# Patient Record
Sex: Female | Born: 1975 | Race: White | Hispanic: No | Marital: Married | Smoking: Former smoker
Health system: Southern US, Community
[De-identification: ages and names within clinical notes are randomized; demographics above are authoritative.]

## PROBLEM LIST (undated history)

## (undated) DIAGNOSIS — W3400XA Accidental discharge from unspecified firearms or gun, initial encounter: Secondary | ICD-10-CM

## (undated) DIAGNOSIS — R011 Cardiac murmur, unspecified: Secondary | ICD-10-CM

## (undated) DIAGNOSIS — F419 Anxiety disorder, unspecified: Secondary | ICD-10-CM

## (undated) DIAGNOSIS — L309 Dermatitis, unspecified: Secondary | ICD-10-CM

## (undated) DIAGNOSIS — R87629 Unspecified abnormal cytological findings in specimens from vagina: Secondary | ICD-10-CM

## (undated) DIAGNOSIS — R569 Unspecified convulsions: Secondary | ICD-10-CM

## (undated) DIAGNOSIS — I639 Cerebral infarction, unspecified: Secondary | ICD-10-CM

## (undated) DIAGNOSIS — IMO0002 Reserved for concepts with insufficient information to code with codable children: Secondary | ICD-10-CM

## (undated) DIAGNOSIS — R519 Headache, unspecified: Secondary | ICD-10-CM

## (undated) DIAGNOSIS — T1490XA Injury, unspecified, initial encounter: Secondary | ICD-10-CM

## (undated) DIAGNOSIS — R87619 Unspecified abnormal cytological findings in specimens from cervix uteri: Secondary | ICD-10-CM

## (undated) HISTORY — PX: TUBAL LIGATION: SHX77

## (undated) HISTORY — PX: KNEE SURGERY: SHX244

## (undated) HISTORY — DX: Unspecified abnormal cytological findings in specimens from vagina: R87.629

## (undated) HISTORY — DX: Injury, unspecified, initial encounter: T14.90XA

## (undated) HISTORY — PX: PILONIDAL CYST EXCISION: SHX744

## (undated) HISTORY — PX: TRACHEOSTOMY: SUR1362

## (undated) HISTORY — DX: Dermatitis, unspecified: L30.9

---

## 1996-02-04 DIAGNOSIS — T1490XA Injury, unspecified, initial encounter: Secondary | ICD-10-CM

## 1996-02-04 HISTORY — DX: Injury, unspecified, initial encounter: T14.90XA

## 1996-02-04 HISTORY — PX: ABDOMINAL SURGERY: SHX537

## 1998-02-22 ENCOUNTER — Ambulatory Visit (HOSPITAL_COMMUNITY): Admission: RE | Admit: 1998-02-22 | Discharge: 1998-02-22 | Payer: Self-pay | Admitting: Orthopedic Surgery

## 1998-02-22 ENCOUNTER — Encounter: Payer: Self-pay | Admitting: Orthopedic Surgery

## 1998-08-29 ENCOUNTER — Inpatient Hospital Stay (HOSPITAL_COMMUNITY): Admission: RE | Admit: 1998-08-29 | Discharge: 1998-09-04 | Payer: Self-pay | Admitting: Orthopedic Surgery

## 2000-09-21 ENCOUNTER — Encounter: Payer: Self-pay | Admitting: Obstetrics and Gynecology

## 2000-09-21 ENCOUNTER — Ambulatory Visit (HOSPITAL_COMMUNITY): Admission: RE | Admit: 2000-09-21 | Discharge: 2000-09-21 | Payer: Self-pay | Admitting: Obstetrics and Gynecology

## 2000-11-16 ENCOUNTER — Other Ambulatory Visit: Admission: RE | Admit: 2000-11-16 | Discharge: 2000-11-16 | Payer: Self-pay | Admitting: Obstetrics and Gynecology

## 2001-04-14 ENCOUNTER — Inpatient Hospital Stay (HOSPITAL_COMMUNITY): Admission: AD | Admit: 2001-04-14 | Discharge: 2001-04-16 | Payer: Self-pay | Admitting: Obstetrics and Gynecology

## 2004-05-20 ENCOUNTER — Ambulatory Visit (HOSPITAL_COMMUNITY): Admission: RE | Admit: 2004-05-20 | Discharge: 2004-05-20 | Payer: Self-pay | Admitting: Internal Medicine

## 2004-06-03 ENCOUNTER — Encounter: Admission: RE | Admit: 2004-06-03 | Discharge: 2004-06-03 | Payer: Self-pay | Admitting: Internal Medicine

## 2004-06-12 ENCOUNTER — Observation Stay (HOSPITAL_COMMUNITY): Admission: RE | Admit: 2004-06-12 | Discharge: 2004-06-13 | Payer: Self-pay | Admitting: Internal Medicine

## 2005-08-08 ENCOUNTER — Ambulatory Visit (HOSPITAL_COMMUNITY): Admission: RE | Admit: 2005-08-08 | Discharge: 2005-08-08 | Payer: Self-pay | Admitting: General Surgery

## 2005-08-08 ENCOUNTER — Encounter (INDEPENDENT_AMBULATORY_CARE_PROVIDER_SITE_OTHER): Payer: Self-pay | Admitting: Specialist

## 2006-11-20 ENCOUNTER — Ambulatory Visit (HOSPITAL_COMMUNITY): Admission: RE | Admit: 2006-11-20 | Discharge: 2006-11-20 | Payer: Self-pay | Admitting: General Surgery

## 2007-01-15 ENCOUNTER — Other Ambulatory Visit: Admission: RE | Admit: 2007-01-15 | Discharge: 2007-01-15 | Payer: Self-pay | Admitting: Obstetrics and Gynecology

## 2008-01-17 ENCOUNTER — Other Ambulatory Visit: Admission: RE | Admit: 2008-01-17 | Discharge: 2008-01-17 | Payer: Self-pay | Admitting: Obstetrics and Gynecology

## 2009-05-11 ENCOUNTER — Other Ambulatory Visit: Admission: RE | Admit: 2009-05-11 | Discharge: 2009-05-11 | Payer: Self-pay | Admitting: Obstetrics and Gynecology

## 2010-02-24 ENCOUNTER — Encounter: Payer: Self-pay | Admitting: Internal Medicine

## 2010-06-13 ENCOUNTER — Other Ambulatory Visit (HOSPITAL_COMMUNITY)
Admission: RE | Admit: 2010-06-13 | Discharge: 2010-06-13 | Disposition: A | Discharge status: Home / Self Care | Payer: Medicaid Other | Source: Ambulatory Visit | Attending: Obstetrics and Gynecology | Admitting: Obstetrics and Gynecology

## 2010-06-13 ENCOUNTER — Other Ambulatory Visit: Payer: Self-pay | Admitting: Adult Health

## 2010-06-13 DIAGNOSIS — Z01419 Encounter for gynecological examination (general) (routine) without abnormal findings: Secondary | ICD-10-CM | POA: Insufficient documentation

## 2010-06-18 NOTE — Op Note (Signed)
NAMEFAUSTINA, [name]                ACCOUNT NO.:  [id number]   MEDICAL RECORD NO.:  [id number]          PATIENT TYPE:  AMB   LOCATION:  DAY                           FACILITY:  [facility]   PHYSICIAN:  Dalia Heading, M.D.  DATE OF BIRTH:  [birth year]   DATE OF PROCEDURE:  11/20/2006  DATE OF DISCHARGE:                               OPERATIVE REPORT   PREOPERATIVE DIAGNOSIS:  Subcutaneous neoplasms, scalp and chest wall.   POSTOPERATIVE DIAGNOSIS:  Sebaceous cyst, scalp and chest wall.   PROCEDURE:  Excision of sebaceous cyst, scalp and chest wall.   SURGEON:  Dalia Heading, M.D.   ANESTHESIA:  General.   INDICATIONS:  The patient is a 35 year old white female who presents  with two enlarging subcutaneous masses, one behind the right ear in the  scalp and another one along the left posterolateral chest wall.  The  risks and benefits of both procedures were fully explained to the  patient who gave informed consent.   PROCEDURE NOTE:  The patient was placed in the left lateral decubitus  position after general anesthesia was administered.  An area behind the  right ear was prepped and draped using the usual sterile technique with  Betadine.  Surgical site confirmation was performed.   A 1.5-cm subcutaneous mass was present.  Incision was made, and the  sebaceous cyst was found.  This was excised without difficulty.  Any  bleeding was controlled using Bovie electrocautery.  The incision was  instilled with 0.5% Sensorcaine.  The incision was closed using a 4-0  nylon interrupted suture.  Neosporin ointment was then applied.   Next, the patient was placed in the right lateral decubitus position.  A  2-cm sebaceous cyst was found along the posterolateral aspect of the  lower left chest.  This was excised without difficulty.  Any bleeding  was controlled using Bovie electrocautery.  The subcutaneous layer was  instilled with 0.5% Sensorcaine.  The skin was closed using 4-0 nylon  interrupted sutures.  Neosporin ointment and dry sterile dressing were  applied.   All tape and needle counts were correct at the end of the procedures.  The patient was awakened and transferred to the PACU in stable  condition.   COMPLICATIONS:  None.   SPECIMEN:  Sebaceous cysts, scalp and back.   BLOOD LOSS:  Minimal.      Dalia Heading, M.D.  Electronically Signed     MAJ/MEDQ  D:  11/20/2006  T:  11/22/2006  Job:  [id number]   cc:   Corrie Mckusick, M.D.  Fax: [phone]
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
## 2010-06-21 NOTE — Op Note (Signed)
NAMEJANNATUL, [name]                ACCOUNT NO.:  [id number]   MEDICAL RECORD NO.:  [id number]          PATIENT TYPE:  AMB   LOCATION:  DAY                           FACILITY:  [facility]   PHYSICIAN:  Dalia Heading, M.D.  DATE OF BIRTH:  08/15/1975   DATE OF PROCEDURE:  08/08/2005  DATE OF DISCHARGE:                                 OPERATIVE REPORT   PREOPERATIVE DIAGNOSIS:  Recurrent pilonidal cyst.   POSTOPERATIVE DIAGNOSIS:  Recurrent pilonidal cyst.   PROCEDURE:  Excision of pilonidal cyst.   SURGEON:  Dalia Heading, M.D.   ANESTHESIA:  General.   INDICATIONS:  The patient is a 35 year old white female who presents with a  recurrent pilonidal cyst.  She has had a previous excision of the pilonidal  cyst in the past.  The risks and benefits of the procedure were fully  explained, including bleeding, infection, and recurrence of the cyst, were  fully explained to the patient, who gave informed consent.   PROCEDURE NOTE:  The patient was placed in the left lateral decubitus  position after general anesthesia was administered.  The pilonidal region  was prepped and draped using the usual sterile technique with Betadine.  Surgical site confirmation was performed.   The patient was noted to have an opening in the previous surgical scar  tissue over the distal coccyx.  An elliptical incision was made around this  region and taken down to the coccyx.  The specimen was sent to pathology for  further examination.  The wound was irrigated with normal saline.  The  subcutaneous layer was reapproximated using 3-0 Vicryl interrupted sutures.  The skin was closed using 4-0 nylon interrupted sutures.  Sensorcaine 0.5%  was instilled in the surrounding wound.  Betadine ointment and a dry sterile  dressing were applied.   All tape and needle counts were correct at the end of the procedure.  The  patient was awakened and transferred to PACU in stable condition.   COMPLICATIONS:  None.   SPECIMEN:  Pilonidal cyst.   BLOOD LOSS:  Minimal.      Dalia Heading, M.D.  Electronically Signed     MAJ/MEDQ  D:  08/08/2005  T:  08/08/2005  Job:  [id number]   cc:   Julaine Fusi, M.D.  Fax: [phone]

## 2010-06-21 NOTE — Op Note (Signed)
[name], [name]                ACCOUNT NO.:  [id number]   MEDICAL RECORD NO.:  [id number]          PATIENT TYPE:  AMB   LOCATION:  DAY                           FACILITY:  [facility]   PHYSICIAN:  Bernerd Limbo. Destefano, M.D.DATE OF BIRTH:  02-19-1975   DATE OF PROCEDURE:  06/12/2004  DATE OF DISCHARGE:                                 OPERATIVE REPORT   PREOPERATIVE DIAGNOSIS:  Infected pilonidal cyst.   POSTOPERATIVE DIAGNOSIS:  Infected pilonidal cyst.   OPERATION PERFORMED:  Incision of pilonidal cyst.   ANESTHESIA:  Spinal.   SURGEON:  Dr. Bernerd Limbo. Leona Carry, M.D.   OPERATIVE PROCEDURE:  Under spinal anesthesia, the patient was prepped and  draped in the jack-knife position. An elliptical incision was made to excise  a large pilonidal cyst. Dissection was carried down to the level of the  sacrococcygeal fascia and the cyst removed in toto. Bleeders were  electrofulgurated. The subcutaneous tissues were undermined over the  sacrococcygeal fascia. In some places, it was reapproximated with  interrupted 2-0 Vicryl. A layer of fatty tissue was closed over the top of  the sacrococcygeal fascia including the sacrococcygeal fashion with  continuous 2-0 Vicryl. A second layer over this was opposed with continuous  2-0 Vicryl, and the skin was closed with interrupted 3-0 nylon sutures. At  completion of the procedure, all bleeders were under control. There appeared  to be very little tension at the site of the incision. The patient had been  informed preoperatively that if it was impossible to bring it together we  would do a marsupialization. She tolerated the procedure nicely and left in  good condition.      NMD/MEDQ  D:  06/12/2004  T:  06/12/2004  Job:  [id number]

## 2010-06-21 NOTE — Op Note (Signed)
[facility]  Patient:    [name], [name] Visit Number: [id number] MRN: [id number]          Service Type: OBS Location: 4A A420 01 Attending Physician:  Tilda Burrow Dictated by:   Zerita Boers, CNM Proc. Date: 04/14/01 Admit Date:  04/14/2001   CC:         [facility]   Operative Report  ONSET OF LABOR:  04/14/01.  DATE OF DELIVERY: 04/14/01 at 1818 hours.  LENGTH OF FIRST STAGE OF LABOR:  3 hours and 35 minutes.  LENGTH OF SECOND STAGE OF LABOR:  23 minutes.  LENGTH OF THIRD STAGE OF LABOR:  7 minutes.  DELIVERY NOTE:  Dr. Simone Curia was in attendance for baby.  Audra delivered a viable female infant spontaneously without any difficulty of a 34-week-5-day gestation.  Upon delivery the infant had lusty cry, good tone, good movement.  Cord was clamped and infant passed off to Dr. Gerda Diss to attend.  Upon inspection, a significant perineal laceration was noted.  Local infiltration of 1% lidocaine was utilized as local anesthetic and laceration repaired with three interrupted sutures of 2-0 Vicryl with good hemostasis obtained.  A cord blood culture was obtained due to prolonged rupture of membranes and also a culture of the placenta was obtained.  Placenta was delivered spontaneously via Tomasa Blase mechanism.  Three-vessel cord was noted upon inspection.  Also placenta was sent to pathology for inspection.  Infant and mother were both stabilized.  The mother transferred out to the postpartum unit in stable condition and infant to the newborn nursery due to low birth weight of 4 pounds 1 ounce.  Estimated blood loss was approximately 300 cc. Dictated by:   Zerita Boers, CNM Attending Physician:  Tilda Burrow DD:  04/14/01 TD:  04/15/01 Job: [id number]

## 2010-07-23 ENCOUNTER — Other Ambulatory Visit: Payer: Self-pay | Admitting: Obstetrics and Gynecology

## 2010-08-20 ENCOUNTER — Other Ambulatory Visit (HOSPITAL_COMMUNITY)
Admission: RE | Admit: 2010-08-20 | Discharge: 2010-08-20 | Disposition: A | Discharge status: Home / Self Care | Payer: Medicaid Other | Source: Ambulatory Visit | Attending: Obstetrics and Gynecology | Admitting: Obstetrics and Gynecology

## 2010-08-20 ENCOUNTER — Other Ambulatory Visit: Payer: Self-pay | Admitting: Obstetrics and Gynecology

## 2010-08-20 DIAGNOSIS — R87619 Unspecified abnormal cytological findings in specimens from cervix uteri: Secondary | ICD-10-CM | POA: Insufficient documentation

## 2010-11-13 LAB — BASIC METABOLIC PANEL
CO2: 26
Calcium: 9.4
GFR calc Af Amer: >60
Glucose, Bld: 67 — ABNORMAL LOW
Potassium: 4.3
Sodium: 136

## 2010-11-13 LAB — HCG, QUANTITATIVE, PREGNANCY: hCG, Beta Chain, Quant, S: <2

## 2010-11-13 LAB — CBC
MCHC: 33.5
RDW: 13.4

## 2010-12-03 ENCOUNTER — Other Ambulatory Visit (HOSPITAL_COMMUNITY)
Admission: RE | Admit: 2010-12-03 | Discharge: 2010-12-03 | Disposition: A | Discharge status: Home / Self Care | Payer: Medicaid Other | Source: Ambulatory Visit | Attending: Obstetrics and Gynecology | Admitting: Obstetrics and Gynecology

## 2010-12-03 ENCOUNTER — Other Ambulatory Visit: Payer: Self-pay | Admitting: Obstetrics and Gynecology

## 2010-12-03 DIAGNOSIS — Z01419 Encounter for gynecological examination (general) (routine) without abnormal findings: Secondary | ICD-10-CM | POA: Insufficient documentation

## 2011-02-04 NOTE — L&D Delivery Note (Signed)
Delivery Note  Pt is a 36 y.o. G2P0101 at [redacted]w[redacted]d who presented to MAU in active labor. She progressed normally and at 2:49 AM a viable and healthy female was delivered via Vaginal, Spontaneous Delivery (Presentation: vertex ; Occiput Anterior).  APGAR: 8, 9; weight pending.   Placenta status: Intact, Spontaneous.  Cord: 3 vessels with the following complications: None.   Anesthesia: Epidural  Episiotomy: None Lacerations: 2nd degree Suture Repair: 3.0 vicryl Est. Blood Loss (mL): 300  Mom to postpartum.  Baby to nursery-stable.  Maylon Cos, CNM present for delivery.  [name] 09/13/2011, 3:26 AM

## 2011-02-14 LAB — OB RESULTS CONSOLE HIV ANTIBODY (ROUTINE TESTING): HIV: NONREACTIVE

## 2011-02-14 LAB — OB RESULTS CONSOLE GC/CHLAMYDIA: Gonorrhea: NEGATIVE

## 2011-02-14 LAB — OB RESULTS CONSOLE RPR: RPR: NONREACTIVE

## 2011-08-19 LAB — OB RESULTS CONSOLE GBS: GBS: NEGATIVE

## 2011-09-12 ENCOUNTER — Encounter (HOSPITAL_COMMUNITY): Payer: Self-pay | Admitting: Anesthesiology

## 2011-09-12 ENCOUNTER — Inpatient Hospital Stay (HOSPITAL_COMMUNITY)
Admission: AD | Admit: 2011-09-12 | Discharge: 2011-09-12 | Disposition: A | Discharge status: Home / Self Care | Payer: Medicaid Other | Source: Ambulatory Visit | Attending: Obstetrics and Gynecology | Admitting: Obstetrics and Gynecology

## 2011-09-12 ENCOUNTER — Encounter (HOSPITAL_COMMUNITY): Payer: Self-pay | Admitting: *Deleted

## 2011-09-12 ENCOUNTER — Inpatient Hospital Stay (HOSPITAL_COMMUNITY): Payer: Medicaid Other | Admitting: Anesthesiology

## 2011-09-12 ENCOUNTER — Inpatient Hospital Stay (HOSPITAL_COMMUNITY)
Admission: AD | Admit: 2011-09-12 | Discharge: 2011-09-14 | DRG: 775 | Disposition: A | Discharge status: Home / Self Care | Payer: Medicaid Other | Source: Ambulatory Visit | Attending: Obstetrics and Gynecology | Admitting: Obstetrics and Gynecology

## 2011-09-12 DIAGNOSIS — IMO0001 Reserved for inherently not codable concepts without codable children: Secondary | ICD-10-CM

## 2011-09-12 DIAGNOSIS — O99891 Other specified diseases and conditions complicating pregnancy: Secondary | ICD-10-CM | POA: Insufficient documentation

## 2011-09-12 DIAGNOSIS — N949 Unspecified condition associated with female genital organs and menstrual cycle: Secondary | ICD-10-CM | POA: Insufficient documentation

## 2011-09-12 DIAGNOSIS — Z3490 Encounter for supervision of normal pregnancy, unspecified, unspecified trimester: Secondary | ICD-10-CM

## 2011-09-12 DIAGNOSIS — O09529 Supervision of elderly multigravida, unspecified trimester: Secondary | ICD-10-CM | POA: Diagnosis present

## 2011-09-12 DIAGNOSIS — R109 Unspecified abdominal pain: Secondary | ICD-10-CM | POA: Insufficient documentation

## 2011-09-12 DIAGNOSIS — O99334 Smoking (tobacco) complicating childbirth: Secondary | ICD-10-CM | POA: Diagnosis present

## 2011-09-12 HISTORY — DX: Reserved for concepts with insufficient information to code with codable children: IMO0002

## 2011-09-12 HISTORY — DX: Unspecified convulsions: R56.9

## 2011-09-12 HISTORY — DX: Unspecified abnormal cytological findings in specimens from cervix uteri: R87.619

## 2011-09-12 LAB — CBC
HCT: 36.3 % (ref 36.0–46.0)
MCHC: 32.5 g/dL (ref 30.0–36.0)
MCV: 81.8 fL (ref 78.0–100.0)
RDW: 15.2 % (ref 11.5–15.5)

## 2011-09-12 MED ORDER — IBUPROFEN 600 MG PO TABS
600.0000 mg | ORAL_TABLET | Freq: Four times a day (QID) | ORAL | Status: DC | PRN
  Administered 2011-09-13: 600 mg via ORAL
  Filled 2011-09-12: qty 1

## 2011-09-12 MED ORDER — ACETAMINOPHEN 325 MG PO TABS: 650.0000 mg | ORAL_TABLET | ORAL | Status: DC | PRN

## 2011-09-12 MED ORDER — FENTANYL 2.5 MCG/ML BUPIVACAINE 1/10 % EPIDURAL INFUSION (WH - ANES)
14.0000 mL/h | INTRAMUSCULAR | Status: DC
  Administered 2011-09-12: 14 mL/h via EPIDURAL
  Filled 2011-09-12: qty 60

## 2011-09-12 MED ORDER — FLEET ENEMA 7-19 GM/118ML RE ENEM: 1.0000 | ENEMA | RECTAL | Status: DC | PRN

## 2011-09-12 MED ORDER — OXYTOCIN 40 UNITS IN LACTATED RINGERS INFUSION - SIMPLE MED
62.5000 mL/h | Freq: Once | INTRAVENOUS | Status: AC
  Administered 2011-09-13: 250 mL/h via INTRAVENOUS
  Filled 2011-09-12: qty 1000

## 2011-09-12 MED ORDER — SODIUM CHLORIDE 0.9 % IJ SOLN: 3.0000 mL | INTRAMUSCULAR | Status: DC | PRN

## 2011-09-12 MED ORDER — FENTANYL CITRATE 0.05 MG/ML IJ SOLN: 100.0000 ug | INTRAMUSCULAR | Status: DC | PRN

## 2011-09-12 MED ORDER — LACTATED RINGERS IV SOLN
500.0000 mL | INTRAVENOUS | Status: DC | PRN
  Administered 2011-09-12: 1000 mL via INTRAVENOUS

## 2011-09-12 MED ORDER — PHENYLEPHRINE 40 MCG/ML (10ML) SYRINGE FOR IV PUSH (FOR BLOOD PRESSURE SUPPORT)
80.0000 ug | PREFILLED_SYRINGE | INTRAVENOUS | Status: DC | PRN
  Filled 2011-09-12: qty 5

## 2011-09-12 MED ORDER — EPHEDRINE 5 MG/ML INJ
10.0000 mg | INTRAVENOUS | Status: DC | PRN
  Filled 2011-09-12: qty 4

## 2011-09-12 MED ORDER — DIPHENHYDRAMINE HCL 50 MG/ML IJ SOLN: 12.5000 mg | INTRAMUSCULAR | Status: DC | PRN

## 2011-09-12 MED ORDER — CITRIC ACID-SODIUM CITRATE 334-500 MG/5ML PO SOLN: 30.0000 mL | ORAL | Status: DC | PRN

## 2011-09-12 MED ORDER — LIDOCAINE HCL (PF) 1 % IJ SOLN
30.0000 mL | INTRAMUSCULAR | Status: DC | PRN
  Administered 2011-09-13: 30 mL via SUBCUTANEOUS
  Filled 2011-09-12: qty 30

## 2011-09-12 MED ORDER — OXYTOCIN BOLUS FROM INFUSION
250.0000 mL | Freq: Once | INTRAVENOUS | Status: DC
  Filled 2011-09-12: qty 500

## 2011-09-12 MED ORDER — SODIUM CHLORIDE 0.9 % IJ SOLN: 3.0000 mL | Freq: Two times a day (BID) | INTRAMUSCULAR | Status: DC

## 2011-09-12 MED ORDER — PHENYLEPHRINE 40 MCG/ML (10ML) SYRINGE FOR IV PUSH (FOR BLOOD PRESSURE SUPPORT): 80.0000 ug | PREFILLED_SYRINGE | INTRAVENOUS | Status: DC | PRN

## 2011-09-12 MED ORDER — ONDANSETRON HCL 4 MG/2ML IJ SOLN: 4.0000 mg | Freq: Four times a day (QID) | INTRAMUSCULAR | Status: DC | PRN

## 2011-09-12 MED ORDER — LIDOCAINE HCL (PF) 1 % IJ SOLN
INTRAMUSCULAR | Status: DC | PRN
  Administered 2011-09-12 (×3): 4 mL

## 2011-09-12 MED ORDER — LACTATED RINGERS IV SOLN
500.0000 mL | Freq: Once | INTRAVENOUS | Status: AC
  Administered 2011-09-12: 500 mL via INTRAVENOUS

## 2011-09-12 MED ORDER — EPHEDRINE 5 MG/ML INJ: 10.0000 mg | INTRAVENOUS | Status: DC | PRN

## 2011-09-12 MED ORDER — LACTATED RINGERS IV SOLN
INTRAVENOUS | Status: DC
  Administered 2011-09-12: 23:00:00 via INTRAVENOUS

## 2011-09-12 MED ORDER — SODIUM CHLORIDE 0.9 % IV SOLN: 250.0000 mL | INTRAVENOUS | Status: DC | PRN

## 2011-09-12 NOTE — MAU Provider Note (Signed)
[name] E.  

## 2011-09-12 NOTE — Progress Notes (Signed)
Dr Thad Ranger on unit and will see pt

## 2011-09-12 NOTE — Anesthesia Procedure Notes (Signed)
Epidural Patient location during procedure: OB Start time: 09/12/2011 10:54 PM Reason for block: procedure for pain  Staffing Performed by: anesthesiologist   Preanesthetic Checklist Completed: patient identified, site marked, surgical consent, pre-op evaluation, timeout performed, IV checked, risks and benefits discussed and monitors and equipment checked  Epidural Patient position: sitting Prep: site prepped and draped and DuraPrep Patient monitoring: continuous pulse ox and blood pressure Approach: midline Injection technique: LOR air  Needle:  Needle type: Tuohy  Needle gauge: 17 G Needle length: 9 cm Needle insertion depth: 6 cm Catheter type: closed end flexible Catheter size: 19 Gauge Catheter at skin depth: 11 cm Test dose: negative  Assessment Events: blood not aspirated, injection not painful, no injection resistance, negative IV test and no paresthesia  Additional Notes Discussed risk of headache, infection, bleeding, nerve injury and failed or incomplete block.  Patient voices understanding and wishes to proceed.

## 2011-09-12 NOTE — MAU Note (Signed)
Pains started at 0430. Coming every few minutes now.  Denies problem with pregnancy.  First preg was preterm delivery.  No bleeding, yellow thin/watery d/c.

## 2011-09-12 NOTE — OB Triage Provider Note (Signed)
36 y.o. G2P0101 at [redacted]w[redacted]d presenting for contractions of increasing intensity. Seen in MAU earlier today and discharged home. Cervical change over course of MAU stay 3/80/-2 to 4/90/-2. Admitted to labor and delivery.  See H&P for full note.  Camillia Herter Thad Ranger, MD
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
## 2011-09-12 NOTE — Progress Notes (Signed)
Dr Thad Ranger notified that pt returned from walking early due to being more uncomfortable. Will come see pt and reck.

## 2011-09-12 NOTE — MAU Note (Signed)
EFM off. Pt to walk X 1 hr and will recheck cervix

## 2011-09-12 NOTE — MAU Note (Signed)
Donette Larry, CNM notified and given report on pt VE, Reactive Strip, and Hx.  Telephone orders for pt to ambulate in halls obtained.

## 2011-09-12 NOTE — MAU Note (Signed)
Ctx's now every , but stronger.  No bleeding or leaking.

## 2011-09-12 NOTE — Progress Notes (Signed)
Dr Thad Ranger in to see pt. Discussing admission plans with pt

## 2011-09-12 NOTE — Progress Notes (Signed)
Dr Thad Ranger in to see pt and discuss plan of care

## 2011-09-12 NOTE — Progress Notes (Signed)
Pt up to walk with friend. Will return about 2100. To return sooner for leaking of fld. Or heavy vag bleeding

## 2011-09-12 NOTE — Progress Notes (Signed)
[name] is a 36 y.o. G2P0101 at [redacted]w[redacted]d by ultrasound admitted for active labor.  Subjective: Pt blocked and comfortable. SROM approx 11:25 PM, clear. Feeling pressure.  Objective: BP 125/74  Pulse 96  Temp 98.4 F (36.9 C) (Oral)  Resp 20  Ht 5\' 4"  (1.626 m)  Wt 80.74 kg (178 lb)  BMI 30.55 kg/m2  LMP 01/04/2011     FHT:  FHR: 135 bpm, variability: moderate,  accelerations:  Present,  decelerations:  Absent UC:   regular, every 2-3 minutes SVE:   Dilation: 5.5 Effacement (%): 90 Station: -2 Exam by:: Dr. Thad Ranger  Labs: Lab Results  Component Value Date   WBC 15.1* 09/12/2011   HGB 11.8* 09/12/2011   HCT 36.3 09/12/2011   MCV 81.8 09/12/2011   PLT 297 09/12/2011    Assessment / Plan: Spontaneous labor, progressing normally  Labor: Progressing normally Fetal Wellbeing:  Category I Pain Control:  Epidural I/D:  n/a Anticipated MOD:  NSVD  [name] 09/12/2011, 11:30 PM

## 2011-09-12 NOTE — Progress Notes (Signed)
Arlys John RN called report to [employer] in [employer]

## 2011-09-12 NOTE — MAU Note (Signed)
Pt woke up with U/C's and are now every 3 min and getting more intense.  No vaginal bleeding or ROM.  Good fetal movement.

## 2011-09-12 NOTE — Progress Notes (Signed)
Pt returned from walking. States ctxs stronger and "making me bend over". To BR and then efm reapplied.
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
## 2011-09-13 ENCOUNTER — Encounter (HOSPITAL_COMMUNITY): Payer: Self-pay | Admitting: *Deleted

## 2011-09-13 DIAGNOSIS — O99334 Smoking (tobacco) complicating childbirth: Secondary | ICD-10-CM

## 2011-09-13 DIAGNOSIS — O09529 Supervision of elderly multigravida, unspecified trimester: Secondary | ICD-10-CM

## 2011-09-13 LAB — CBC
HCT: 32.4 % — ABNORMAL LOW (ref 36.0–46.0)
Hemoglobin: 10.5 g/dL — ABNORMAL LOW (ref 12.0–15.0)
MCH: 26.8 pg (ref 26.0–34.0)
MCV: 82.7 fL (ref 78.0–100.0)
RBC: 3.92 MIL/uL (ref 3.87–5.11)

## 2011-09-13 MED ORDER — LANOLIN HYDROUS EX OINT: TOPICAL_OINTMENT | CUTANEOUS | Status: DC | PRN

## 2011-09-13 MED ORDER — TETANUS-DIPHTH-ACELL PERTUSSIS 5-2.5-18.5 LF-MCG/0.5 IM SUSP: 0.5000 mL | Freq: Once | INTRAMUSCULAR | Status: DC

## 2011-09-13 MED ORDER — DIPHENHYDRAMINE HCL 25 MG PO CAPS: 25.0000 mg | ORAL_CAPSULE | Freq: Four times a day (QID) | ORAL | Status: DC | PRN

## 2011-09-13 MED ORDER — IBUPROFEN 600 MG PO TABS
600.0000 mg | ORAL_TABLET | Freq: Four times a day (QID) | ORAL | Status: DC
  Administered 2011-09-13 – 2011-09-14 (×5): 600 mg via ORAL
  Filled 2011-09-13 (×5): qty 1

## 2011-09-13 MED ORDER — ONDANSETRON HCL 4 MG/2ML IJ SOLN: 4.0000 mg | INTRAMUSCULAR | Status: DC | PRN

## 2011-09-13 MED ORDER — SIMETHICONE 80 MG PO CHEW: 80.0000 mg | CHEWABLE_TABLET | ORAL | Status: DC | PRN

## 2011-09-13 MED ORDER — OXYCODONE-ACETAMINOPHEN 5-325 MG PO TABS: 1.0000 | ORAL_TABLET | ORAL | Status: DC | PRN

## 2011-09-13 MED ORDER — DIBUCAINE 1 % RE OINT: 1.0000 "application " | TOPICAL_OINTMENT | RECTAL | Status: DC | PRN

## 2011-09-13 MED ORDER — ONDANSETRON HCL 4 MG PO TABS: 4.0000 mg | ORAL_TABLET | ORAL | Status: DC | PRN

## 2011-09-13 MED ORDER — SENNOSIDES-DOCUSATE SODIUM 8.6-50 MG PO TABS
2.0000 | ORAL_TABLET | Freq: Every day | ORAL | Status: DC
  Administered 2011-09-13: 2 via ORAL

## 2011-09-13 MED ORDER — ZOLPIDEM TARTRATE 5 MG PO TABS: 5.0000 mg | ORAL_TABLET | Freq: Every evening | ORAL | Status: DC | PRN

## 2011-09-13 MED ORDER — WITCH HAZEL-GLYCERIN EX PADS: 1.0000 "application " | MEDICATED_PAD | CUTANEOUS | Status: DC | PRN

## 2011-09-13 MED ORDER — BENZOCAINE-MENTHOL 20-0.5 % EX AERO
1.0000 "application " | INHALATION_SPRAY | CUTANEOUS | Status: DC | PRN
  Administered 2011-09-13: 1 via TOPICAL
  Filled 2011-09-13: qty 56

## 2011-09-13 NOTE — Anesthesia Postprocedure Evaluation (Signed)
  Anesthesia Post-op Note  Patient: [name]  Procedure(s) Performed: * No procedures listed *  Patient Location: Mother/Baby  Anesthesia Type: Epidural  Level of Consciousness: awake  Airway and Oxygen Therapy: Patient Spontanous Breathing  Post-op Pain: mild  Post-op Assessment: Patient's Cardiovascular Status Stable and Respiratory Function Stable  Post-op Vital Signs: stable  Complications: No apparent anesthesia complications

## 2011-09-13 NOTE — Progress Notes (Signed)
[name] is a 36 y.o. G2P0101 at [redacted]w[redacted]d by ultrasound admitted for active labor  Subjective: Pt comfortable. Feeling pressure like she needs to have bowel movement.  Objective: BP 118/68  Pulse 81  Temp 98.4 F (36.9 C) (Oral)  Resp 18  Ht 5\' 4"  (1.626 m)  Wt 80.74 kg (178 lb)  BMI 30.55 kg/m2  LMP 01/04/2011     FHT:  FHR: 135 bpm, variability: minimal ,  accelerations:  Present,  decelerations:  Absent 10 minute strip with minimal variability and no accels. Checked pt and turned, strip now more reactive. UC:   regular, every 2 minutes SVE:   Dilation: 6 Effacement (%): 90 Station: -2 Exam by:: Dr. Thad Ranger  Labs: Lab Results  Component Value Date   WBC 15.1* 09/12/2011   HGB 11.8* 09/12/2011   HCT 36.3 09/12/2011   MCV 81.8 09/12/2011   PLT 297 09/12/2011    Assessment / Plan: Spontaneous labor, progressing normally  Labor: Progressing normally Fetal Wellbeing:  Category II Pain Control:  Epidural I/D:  n/a Anticipated MOD:  NSVD  [name] 09/13/2011, 12:00 AM

## 2011-09-14 MED ORDER — DOCUSATE SODIUM 100 MG PO CAPS: 100.0000 mg | ORAL_CAPSULE | Freq: Two times a day (BID) | ORAL | Status: AC

## 2011-09-14 MED ORDER — IBUPROFEN 600 MG PO TABS: 600.0000 mg | ORAL_TABLET | Freq: Four times a day (QID) | ORAL | Status: AC

## 2011-09-14 MED ORDER — IBUPROFEN 200 MG PO TABS: 600.0000 mg | ORAL_TABLET | Freq: Four times a day (QID) | ORAL | Status: AC | PRN

## 2011-09-14 MED ORDER — HYDROCODONE-ACETAMINOPHEN 5-500 MG PO TABS: 1.0000 | ORAL_TABLET | Freq: Four times a day (QID) | ORAL | Status: AC | PRN

## 2011-09-14 NOTE — Discharge Summary (Signed)
Obstetric Discharge Summary Reason for Admission: onset of labor Prenatal Procedures: ultrasound Intrapartum Procedures: spontaneous vaginal delivery Postpartum Procedures: none Complications-Operative and Postpartum: none   Delivery Note At 2:49 AM a viable female was delivered via Vaginal, Spontaneous Delivery (Presentation: ; Occiput Anterior).  APGAR: 8, 9; weight 6 lb 8.1 oz (2951 g).   Placenta status: Intact, Spontaneous.  Cord: 3 vessels with the following complications: None.   Anesthesia: Epidural  Episiotomy: None Lacerations: 2nd degree Suture Repair: none Est. Blood Loss (mL): 300  Mom to postpartum.  Baby to nursery-stable.  Nola Botkins H 09/14/2011, 8:30 AM     H/H: Lab Results  Component Value Date/Time   HGB 10.5* 09/13/2011  7:06 AM   HCT 32.4* 09/13/2011  7:06 AM      Discharge Diagnoses: Term Pregnancy-delivered  Discharge Information: Date: 08/15/2010 Activity: unrestricted Diet: routine Medications: Ibuprofen and Vicodin Breast feeding:  Yes Condition: stable Instructions: refer to practice specific booklet Discharge to: home   Harry Bark H 09/14/2011,8:30 AM

## 2011-09-14 NOTE — Progress Notes (Signed)
Post Partum Day 1, SVD  Subjective: up ad lib, voiding, tolerating PO, + flatus and pain controlled.  Objective: Blood pressure 125/74, pulse 59, temperature 97.8 F (36.6 C), temperature source Oral, resp. rate 18, height 5\' 4"  (1.626 m), weight 80.74 kg (178 lb), last menstrual period 01/04/2011, SpO2 99.00%, unknown if currently breastfeeding.  Physical Exam:  General: alert, cooperative and no distress Lochia: appropriate Uterine Fundus: firm Incision: N/A DVT Evaluation: No evidence of DVT seen on physical exam.   Basename 09/13/11 0706 09/12/11 2155  HGB 10.5* 11.8*  HCT 32.4* 36.3    Assessment/Plan: Discharge home today if tubal ligation to be scheduled as outpatient. Bottle feeding. Plans tubal ligation. Follow up at [facility] in 6 weeks.   LOS: 2 days   [name] 09/14/2011, 7:25 AM

## 2011-09-15 NOTE — Progress Notes (Signed)
Post discharge chart review completed.  

## 2011-12-18 ENCOUNTER — Encounter (HOSPITAL_COMMUNITY): Payer: Self-pay

## 2011-12-23 NOTE — Patient Instructions (Addendum)
   Your procedure is scheduled on: 12/30/2011  Report to [facility] at  8:30   AM.  Call this number if you have problems the morning of surgery: [phone]   Remember:   Do not drink or eat food:After Midnight.  :  Take these medicines the morning of surgery with A SIP OF WATER: none   Do not wear jewelry, make-up or nail polish.  Do not wear lotions, powders, or perfumes. You may wear deodorant.  Do not shave 48 hours prior to surgery. Men may shave face and neck.  Do not bring valuables to the hospital.  Contacts, dentures or bridgework may not be worn into surgery.  Leave suitcase in the car. After surgery it may be brought to your room.  For patients admitted to the hospital, checkout time is 11:00 AM the day of discharge.   Patients discharged the day of surgery will not be allowed to drive home.    Special Instructions: Shower using CHG 2 nights before surgery and the night before surgery.  If you shower the day of surgery use CHG.  Use special wash - you have one bottle of CHG for all showers.  You should use approximately 1/3 of the bottle for each shower.   Please read over the following fact sheets that you were given: Pain Booklet, MRSA Information, Surgical Site Infection Prevention and Care and Recovery After Surgery

## 2011-12-23 NOTE — Patient Instructions (Addendum)
Your procedure is scheduled on: 12/30/2011  Report to [facility] at  8:30   AM.  Call this number if you have problems the morning of surgery: [phone]   Remember:   Do not drink or eat food:After Midnight.  :   Take these medicines the morning of surgery with A SIP OF WATER: none   Do not wear jewelry, make-up or nail polish.  Do not wear lotions, powders, or perfumes. You may wear deodorant.  Do not shave 48 hours prior to surgery. Men may shave face and neck.  Do not bring valuables to the hospital.  Contacts, dentures or bridgework may not be worn into surgery.  Leave suitcase in the car. After surgery it may be brought to your room.  For patients admitted to the hospital, checkout time is 11:00 AM the day of discharge.   Patients discharged the day of surgery will not be allowed to drive home.    Special Instructions: Shower using CHG 2 nights before surgery and the night before surgery.  If you shower the day of surgery use CHG.  Use special wash - you have one bottle of CHG for all showers.  You should use approximately 1/3 of the bottle for each shower.   Please read over the following fact sheets that you were given: Pain Booklet, MRSA Information, Surgical Site Infection Prevention and Care and Recovery After Surgery   Tubal Ligation Care After These instructions give you information on caring for yourself after your procedure. Your doctor may also give you more specific instructions. Call your doctor if you have any problems or questions after your procedure. HOME CARE   Do not drink alcohol for 24 hours.   Do not drive or use public transportation for 24 hours.   Do not sign important papers for 24 hours.   Have someone with you for 24 hours.   Keep eating and doing activities as normal.   Change any bandages (dressings) as told by your doctor.   Do not douche or have sex (intercourse) until your doctor says it is okay.  GET HELP RIGHT AWAY IF:   You have a  rash.   You have trouble breathing.   You have a reaction or have side effects from your medicines.   You have redness, puffiness (swelling), or more pain in the wound area.   You have yellowish-white fluid (pus) coming from the wound or bandage.   You have a fever.   You have a bad smell coming from your wound or bandage.   Your wound opens up after your stiches (sutures) or staples are removed.   You have more belly (abdominal) pain.   You have a lot of bleeding from the vagina.   You have pain in your shoulders and chest.   You feel dizzy or pass out (faint).   You keep feeling sick to your stomach (nauseous) or throw up (vomit).  MAKE SURE YOU:   Understand these instructions.   Will watch this condition.   Will get help right away if you are not doing well or get worse.  Document Released: 10/30/2007 Document Revised: 01/09/2011 Document Reviewed: 10/30/2007 [facility] Patient Information 2012 [place], [place]. Endometrial Ablation Endometrial ablation removes the lining of the uterus (endometrium). It is usually a same day, outpatient treatment. Ablation helps avoid major surgery (such as a hysterectomy). A hysterectomy is removal of the cervix and uterus. Endometrial ablation has less risk and complications, has a shorter recovery period  and is less expensive. After endometrial ablation, most women will have little or no menstrual bleeding. You may not keep your fertility. Pregnancy is no longer likely after this procedure but if you are pre-menopausal, you still need to use a reliable method of birth control following the procedure because pregnancy can occur. REASONS TO HAVE THE PROCEDURE MAY INCLUDE:  Heavy periods.  Bleeding that is causing anemia.  Anovulatory bleeding, very irregular, bleeding.  Bleeding submucous fibroids (on the lining inside the uterus) if they are smaller than 3 centimeters. REASONS NOT TO HAVE THE PROCEDURE MAY INCLUDE:  You wish to have  more children.  You have a pre-cancerous or cancerous problem. The cause of any abnormal bleeding must be diagnosed before having the procedure.  You have pain coming from the uterus.  You have a submucus fibroid larger than 3 centimeters.  You recently had a baby.  You recently had an infection in the uterus.  You have a severe retro-flexed, tipped uterus and cannot insert the instrument to do the ablation.  You had a Cesarean section or deep major surgery on the uterus.  The inner cavity of the uterus is too large for the endometrial ablation instrument. RISKS AND COMPLICATIONS   Perforation of the uterus.  Bleeding.  Infection of the uterus, bladder or vagina.  Injury to surrounding organs.  Cutting the cervix.  An air bubble to the lung (air embolus).  Pregnancy following the procedure.  Failure of the procedure to help the problem requiring hysterectomy.  Decreased ability to diagnose cancer in the lining of the uterus. BEFORE THE PROCEDURE  The lining of the uterus must be tested to make sure there is no pre-cancerous or cancer cells present.  Medications may be given to make the lining of the uterus thinner.  Ultrasound may be used to evaluate the size and look for abnormalities of the uterus.  Future pregnancy is not desired. PROCEDURE  There are different ways to destroy the lining of the uterus.   Resectoscope - radio frequency-alternating electric current is the most common one used.  Cryotherapy - freezing the lining of the uterus.  Heated Free Liquid - heated salt (saline) solution inserted into the uterus.  Microwave - uses high energy microwaves in the uterus.  Thermal Balloon - a catheter with a balloon tip is inserted into the uterus and filled with heated fluid. Your caregiver will talk with you about the method used in this clinic. They will also instruct you on the pros and cons of the procedure. Endometrial ablation is performed along with  a procedure called operative hysteroscopy. A narrow viewing tube is inserted through the birth canal (vagina) and through the cervix into the uterus. A tiny camera attached to the viewing tube (hysteroscope) allows the uterine cavity to be shown on a TV monitor during surgery. Your uterus is filled with a harmless liquid to make the procedure easier. The lining of the uterus is then removed. The lining can also be removed with a resectoscope which allows your surgeon to cut away the lining of the uterus under direct vision. Usually, you will be able to go home within an hour after the procedure. HOME CARE INSTRUCTIONS   Do not drive for 24 hours.  No tampons, douching or intercourse for 2 weeks or until your caregiver approves.  Rest at home for 24 to 48 hours. You may then resume normal activities unless told differently by your caregiver.  Take your temperature two times a day  for 4 days, and record it.  Take any medications your caregiver has ordered, as directed.  Use some form of contraception if you are pre-menopausal and do not want to get pregnant. Bleeding after the procedure is normal. It varies from light spotting and mildly watery to bloody discharge for 4 to 6 weeks. You may also have mild cramping. Only take over-the-counter or prescription medicines for pain, discomfort, or fever as directed by your caregiver. Do not use aspirin, as this may aggravate bleeding. Frequent urination during the first 24 hours is normal. You will not know how effective your surgery is until at least 3 months after the surgery. SEEK IMMEDIATE MEDICAL CARE IF:   Bleeding is heavier than a normal menstrual cycle.  An oral temperature above 102 F (38.9 C) develops.  You have increasing cramps or pains not relieved with medication or develop belly (abdominal) pain which does not seem to be related to the same area of earlier cramping and pain.  You are light headed, weak or have fainting  episodes.  You develop pain in the shoulder strap areas.  You have chest or leg pain.  You have abnormal vaginal discharge.  You have painful urination. Document Released: 11/30/2003 Document Revised: 04/14/2011 Document Reviewed: 02/27/2007 [facility] Patient Information 2013 [place], [place]. Hysteroscopy Hysteroscopy is a procedure used for looking inside the womb (uterus). It may be done for many different reasons, including:  To evaluate abnormal bleeding, fibroid (benign, noncancerous) tumors, polyps, scar tissue (adhesions), and possibly cancer of the uterus.  To look for lumps (tumors) and other uterine growths.  To look for causes of why a woman cannot get pregnant (infertility), causes of recurrent loss of pregnancy (miscarriages), or a lost intrauterine device (IUD).  To perform a sterilization by blocking the fallopian tubes from inside the uterus. A hysteroscopy should be done right after a menstrual period to be sure you are not pregnant. LET YOUR CAREGIVER KNOW ABOUT:   Allergies.  Medicines taken, including herbs, eyedrops, over-the-counter medicines, and creams.  Use of steroids (by mouth or creams).  Previous problems with anesthetics or numbing medicines.  History of bleeding or blood problems.  History of blood clots.  Possibility of pregnancy, if this applies.  Previous surgery.  Other health problems. RISKS AND COMPLICATIONS   Putting a hole in the uterus.  Excessive bleeding.  Infection.  Damage to the cervix.  Injury to other organs.  Allergic reaction to medicines.  Too much fluid used in the uterus for the procedure. BEFORE THE PROCEDURE   Do not take aspirin or blood thinners for a week before the procedure, or as directed. It can cause bleeding.  Arrive at least 60 minutes before the procedure or as directed to read and sign the necessary forms.  Arrange for someone to take you home after the procedure.  If you smoke, do not  smoke for 2 weeks before the procedure. PROCEDURE   Your caregiver may give you medicine to relax you. He or she may also give you a medicine that numbs the area around the cervix (local anesthetic) or a medicine that makes you sleep (general anesthesia).  Sometimes, a medicine is placed in the cervix the day before the procedure. This medicine makes the cervix have a larger opening (dilate). This makes it easier for the instrument to be inserted into the uterus.  A small instrument (hysteroscope) is inserted through the vagina into the uterus. This instrument is similar to a pencil-sized telescope with a light.  During the procedure, air or a liquid is put into the uterus, which allows the surgeon to see better.  Sometimes, tissue is gently scraped from inside the uterus. These tissue samples are sent to a specialist who looks at tissue samples (pathologist). The pathologist will give a report to your caregiver. This will help your caregiver decide if further treatment is necessary. The report will also help your caregiver decide on the best treatment if the test comes back abnormal. AFTER THE PROCEDURE   If you had a general anesthetic, you may be groggy for a couple hours after the procedure.  If you had a local anesthetic, you will be advised to rest at the surgical center or caregiver's office until you are stable and feel ready to go home.  You may have some cramping for a couple days.  You may have bleeding, which varies from light spotting for a few days to menstrual-like bleeding for up to 3 to 7 days. This is normal.  Have someone take you home. FINDING OUT THE RESULTS OF YOUR TEST Not all test results are available during your visit. If your test results are not back during the visit, make an appointment with your caregiver to find out the results. Do not assume everything is normal if you have not heard from your caregiver or the medical facility. It is important for you to follow  up on all of your test results. HOME CARE INSTRUCTIONS   Do not drive for 24 hours or as instructed.  Only take over-the-counter or prescription medicines for pain, discomfort, or fever as directed by your caregiver.  Do not take aspirin. It can cause or aggravate bleeding.  Do not drive or drink alcohol while taking pain medicine.  You may resume your usual diet.  Do not use tampons, douche, or have sexual intercourse for 2 weeks, or as advised by your caregiver.  Rest and sleep for the first 24 to 48 hours.  Take your temperature twice a day for 4 to 5 days. Write it down. Give these temperatures to your caregiver if they are abnormal (above 98.6 F or 37.0 C).  Take medicines your caregiver has ordered as directed.  Follow your caregiver's advice regarding diet, exercise, lifting, driving, and general activities.  Take showers instead of baths for 2 weeks, or as recommended by your caregiver.  If you develop constipation:  Take a mild laxative with the advice of your caregiver.  Eat bran foods.  Drink enough water and fluids to keep your urine clear or pale yellow.  Try to have someone with you or available to you for the first 24 to 48 hours, especially if you had a general anesthetic.  Make sure you and your family understand everything about your operation and recovery.  Follow your caregiver's advice regarding follow-up appointments and Pap smears. SEEK MEDICAL CARE IF:   You feel dizzy or lightheaded.  You feel sick to your stomach (nauseous).  You develop abnormal vaginal discharge.  You develop a rash.  You have an abnormal reaction or allergy to your medicine.  You need stronger pain medicine. SEEK IMMEDIATE MEDICAL CARE IF:   Bleeding is heavier than a normal menstrual period or you have blood clots.  You have an oral temperature above 102 F (38.9 C), not controlled by medicine.  You have increasing cramps or pains not relieved with  medicine.  You develop belly (abdominal) pain that does not seem to be related to the same area of  earlier cramping and pain.  You pass out.  You develop pain in the tops of your shoulders (shoulder strap areas).  You develop shortness of breath. MAKE SURE YOU:   Understand these instructions.  Will watch your condition.  Will get help right away if you are not doing well or get worse. Document Released: 04/28/2000 Document Revised: 04/14/2011 Document Reviewed: 08/21/2008 [facility] Patient Information 2013 [place], [place]. PATIENT INSTRUCTIONS POST-ANESTHESIA  IMMEDIATELY FOLLOWING SURGERY:  Do not drive or operate machinery for the first twenty four hours after surgery.  Do not make any important decisions for twenty four hours after surgery or while taking narcotic pain medications or sedatives.  If you develop intractable nausea and vomiting or a severe headache please notify your doctor immediately.  FOLLOW-UP:  Please make an appointment with your surgeon as instructed. You do not need to follow up with anesthesia unless specifically instructed to do so.  WOUND CARE INSTRUCTIONS (if applicable):  Keep a dry clean dressing on the anesthesia/puncture wound site if there is drainage.  Once the wound has quit draining you may leave it open to air.  Generally you should leave the bandage intact for twenty four hours unless there is drainage.  If the epidural site drains for more than 36-48 hours please call the anesthesia department.  QUESTIONS?:  Please feel free to call your physician or the hospital operator if you have any questions, and they will be happy to assist you.

## 2011-12-24 ENCOUNTER — Encounter (HOSPITAL_COMMUNITY): Payer: Self-pay

## 2011-12-24 ENCOUNTER — Encounter (HOSPITAL_COMMUNITY)
Admission: RE | Admit: 2011-12-24 | Discharge: 2011-12-24 | Disposition: A | Discharge status: Home / Self Care | Payer: Medicaid Other | Source: Ambulatory Visit | Attending: Obstetrics and Gynecology | Admitting: Obstetrics and Gynecology

## 2011-12-24 ENCOUNTER — Other Ambulatory Visit (HOSPITAL_COMMUNITY): Payer: Self-pay | Admitting: Obstetrics and Gynecology

## 2011-12-24 LAB — URINALYSIS, ROUTINE W REFLEX MICROSCOPIC
Bilirubin Urine: NEGATIVE
Glucose, UA: NEGATIVE mg/dL
Ketones, ur: NEGATIVE mg/dL
Protein, ur: NEGATIVE mg/dL
Urobilinogen, UA: 0.2 mg/dL (ref 0.0–1.0)

## 2011-12-24 LAB — BASIC METABOLIC PANEL
BUN: 9 mg/dL (ref 6–23)
Chloride: 104 mEq/L (ref 96–112)
Creatinine, Ser: 0.64 mg/dL (ref 0.50–1.10)
Glucose, Bld: 89 mg/dL (ref 70–99)
Potassium: 4.6 mEq/L (ref 3.5–5.1)

## 2011-12-24 LAB — URINE MICROSCOPIC-ADD ON

## 2011-12-24 LAB — HCG, SERUM, QUALITATIVE: Preg, Serum: NEGATIVE

## 2011-12-24 LAB — CBC
HCT: 40.9 % (ref 36.0–46.0)
Hemoglobin: 13.3 g/dL (ref 12.0–15.0)
MCH: 27.8 pg (ref 26.0–34.0)
MCHC: 32.5 g/dL (ref 30.0–36.0)
RDW: 14.3 % (ref 11.5–15.5)

## 2011-12-24 MED ORDER — MAGNESIUM CITRATE PO SOLN: 300.0000 mL | Freq: Once | ORAL | Status: DC
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
## 2011-12-30 ENCOUNTER — Ambulatory Visit (HOSPITAL_COMMUNITY): Payer: Medicaid Other | Admitting: Anesthesiology

## 2011-12-30 ENCOUNTER — Encounter (HOSPITAL_COMMUNITY): Payer: Self-pay | Admitting: *Deleted

## 2011-12-30 ENCOUNTER — Ambulatory Visit (HOSPITAL_COMMUNITY)
Admission: RE | Admit: 2011-12-30 | Discharge: 2011-12-30 | Disposition: A | Discharge status: Home / Self Care | Payer: Medicaid Other | Source: Ambulatory Visit | Attending: Obstetrics and Gynecology | Admitting: Obstetrics and Gynecology

## 2011-12-30 ENCOUNTER — Encounter (HOSPITAL_COMMUNITY): Payer: Self-pay | Admitting: Anesthesiology

## 2011-12-30 ENCOUNTER — Encounter (HOSPITAL_COMMUNITY)
Admission: RE | Disposition: A | Discharge status: Home / Self Care | Payer: Self-pay | Source: Ambulatory Visit | Attending: Obstetrics and Gynecology

## 2011-12-30 DIAGNOSIS — F17200 Nicotine dependence, unspecified, uncomplicated: Secondary | ICD-10-CM | POA: Insufficient documentation

## 2011-12-30 DIAGNOSIS — N920 Excessive and frequent menstruation with regular cycle: Secondary | ICD-10-CM | POA: Insufficient documentation

## 2011-12-30 DIAGNOSIS — Z01812 Encounter for preprocedural laboratory examination: Secondary | ICD-10-CM | POA: Insufficient documentation

## 2011-12-30 DIAGNOSIS — Z9889 Other specified postprocedural states: Secondary | ICD-10-CM

## 2011-12-30 DIAGNOSIS — Z302 Encounter for sterilization: Secondary | ICD-10-CM | POA: Insufficient documentation

## 2011-12-30 HISTORY — PX: LAPAROSCOPY WITH TUBAL LIGATION: SHX5576

## 2011-12-30 HISTORY — PX: DILITATION & CURRETTAGE/HYSTROSCOPY WITH THERMACHOICE ABLATION: SHX5569

## 2011-12-30 SURGERY — LAPAROSCOPY WITH TUBAL LIGATION
Anesthesia: General | Wound class: Clean Contaminated

## 2011-12-30 MED ORDER — MIDAZOLAM HCL 2 MG/2ML IJ SOLN
1.0000 mg | INTRAMUSCULAR | Status: DC | PRN
  Administered 2011-12-30: 2 mg via INTRAVENOUS

## 2011-12-30 MED ORDER — GLYCOPYRROLATE 0.2 MG/ML IJ SOLN
INTRAMUSCULAR | Status: DC | PRN
  Administered 2011-12-30: 0.4 mg via INTRAVENOUS

## 2011-12-30 MED ORDER — FENTANYL CITRATE 0.05 MG/ML IJ SOLN
INTRAMUSCULAR | Status: AC
  Filled 2011-12-30: qty 5

## 2011-12-30 MED ORDER — MIDAZOLAM HCL 2 MG/2ML IJ SOLN
INTRAMUSCULAR | Status: AC
  Filled 2011-12-30: qty 2

## 2011-12-30 MED ORDER — ONDANSETRON HCL 4 MG/2ML IJ SOLN
4.0000 mg | Freq: Once | INTRAMUSCULAR | Status: AC
  Administered 2011-12-30: 4 mg via INTRAVENOUS

## 2011-12-30 MED ORDER — BUPIVACAINE-EPINEPHRINE 0.5% -1:200000 IJ SOLN
INTRAMUSCULAR | Status: DC | PRN
  Administered 2011-12-30: 8 mL

## 2011-12-30 MED ORDER — DEXAMETHASONE SODIUM PHOSPHATE 4 MG/ML IJ SOLN
4.0000 mg | Freq: Once | INTRAMUSCULAR | Status: AC
  Administered 2011-12-30: 4 mg via INTRAVENOUS

## 2011-12-30 MED ORDER — KETOROLAC TROMETHAMINE 10 MG PO TABS: 10.0000 mg | ORAL_TABLET | Freq: Four times a day (QID) | ORAL | Status: DC | PRN

## 2011-12-30 MED ORDER — MIDAZOLAM HCL 5 MG/5ML IJ SOLN
INTRAMUSCULAR | Status: DC | PRN
  Administered 2011-12-30: 2 mg via INTRAVENOUS

## 2011-12-30 MED ORDER — BUPIVACAINE-EPINEPHRINE PF 0.5-1:200000 % IJ SOLN
INTRAMUSCULAR | Status: AC
  Filled 2011-12-30: qty 10

## 2011-12-30 MED ORDER — DEXTROSE 5 % IV SOLN
INTRAVENOUS | Status: DC | PRN
  Administered 2011-12-30: 500 mL via INTRAVENOUS

## 2011-12-30 MED ORDER — SODIUM CHLORIDE 0.9 % IR SOLN
Status: DC | PRN
  Administered 2011-12-30: 1000 mL

## 2011-12-30 MED ORDER — ATROPINE SULFATE 1 MG/ML IJ SOLN
INTRAMUSCULAR | Status: DC | PRN
  Administered 2011-12-30: .5 mg via INTRAVENOUS

## 2011-12-30 MED ORDER — ROCURONIUM BROMIDE 50 MG/5ML IV SOLN
INTRAVENOUS | Status: AC
  Filled 2011-12-30: qty 1

## 2011-12-30 MED ORDER — ROCURONIUM BROMIDE 100 MG/10ML IV SOLN
INTRAVENOUS | Status: DC | PRN
  Administered 2011-12-30: 35 mg via INTRAVENOUS

## 2011-12-30 MED ORDER — FENTANYL CITRATE 0.05 MG/ML IJ SOLN: 25.0000 ug | INTRAMUSCULAR | Status: DC | PRN

## 2011-12-30 MED ORDER — SODIUM CHLORIDE 0.9 % IR SOLN
Status: DC | PRN
  Administered 2011-12-30: 3000 mL

## 2011-12-30 MED ORDER — NEOSTIGMINE METHYLSULFATE 1 MG/ML IJ SOLN
INTRAMUSCULAR | Status: DC | PRN
  Administered 2011-12-30: 3 mg via INTRAVENOUS

## 2011-12-30 MED ORDER — HYDROCODONE-ACETAMINOPHEN 5-500 MG PO TABS: 1.0000 | ORAL_TABLET | Freq: Four times a day (QID) | ORAL | Status: DC | PRN

## 2011-12-30 MED ORDER — LACTATED RINGERS IV SOLN
INTRAVENOUS | Status: DC
  Administered 2011-12-30 (×2): via INTRAVENOUS

## 2011-12-30 MED ORDER — FENTANYL CITRATE 0.05 MG/ML IJ SOLN
INTRAMUSCULAR | Status: DC | PRN
  Administered 2011-12-30: 150 ug via INTRAVENOUS
  Administered 2011-12-30: 50 ug via INTRAVENOUS

## 2011-12-30 MED ORDER — PROPOFOL 10 MG/ML IV BOLUS
INTRAVENOUS | Status: DC | PRN
  Administered 2011-12-30: 130 mg via INTRAVENOUS

## 2011-12-30 MED ORDER — LIDOCAINE HCL 1 % IJ SOLN
INTRAMUSCULAR | Status: DC | PRN
  Administered 2011-12-30: 50 mg via INTRADERMAL

## 2011-12-30 MED ORDER — BUPIVACAINE HCL (PF) 0.25 % IJ SOLN
INTRAMUSCULAR | Status: DC | PRN
  Administered 2011-12-30: 20 mL

## 2011-12-30 MED ORDER — ONDANSETRON HCL 4 MG/2ML IJ SOLN
INTRAMUSCULAR | Status: AC
  Filled 2011-12-30: qty 2

## 2011-12-30 MED ORDER — KETOROLAC TROMETHAMINE 30 MG/ML IM SOLN: 30.0000 mg | Freq: Once | INTRAMUSCULAR | Status: DC

## 2011-12-30 MED ORDER — ATROPINE SULFATE 1 MG/ML IJ SOLN
INTRAMUSCULAR | Status: AC
  Filled 2011-12-30: qty 1

## 2011-12-30 MED ORDER — PROPOFOL 10 MG/ML IV EMUL
INTRAVENOUS | Status: AC
  Filled 2011-12-30: qty 20

## 2011-12-30 MED ORDER — DEXAMETHASONE SODIUM PHOSPHATE 4 MG/ML IJ SOLN
INTRAMUSCULAR | Status: AC
  Filled 2011-12-30: qty 1

## 2011-12-30 MED ORDER — ONDANSETRON HCL 4 MG/2ML IJ SOLN: 4.0000 mg | Freq: Once | INTRAMUSCULAR | Status: DC | PRN

## 2011-12-30 MED ORDER — LIDOCAINE HCL (PF) 1 % IJ SOLN
INTRAMUSCULAR | Status: AC
  Filled 2011-12-30: qty 5

## 2011-12-30 MED ORDER — BUPIVACAINE HCL (PF) 0.25 % IJ SOLN
INTRAMUSCULAR | Status: AC
  Filled 2011-12-30: qty 30

## 2011-12-30 SURGICAL SUPPLY — 50 items
BAG DECANTER FOR FLEXI CONT (MISCELLANEOUS) ×3 IMPLANT
BAG HAMPER (MISCELLANEOUS) ×3 IMPLANT
BLADE SURG ROTATE 9660 (MISCELLANEOUS) ×3 IMPLANT
BLADE SURG SZ11 CARB STEEL (BLADE) ×3 IMPLANT
CATH ROBINSON RED A/P 14FR (CATHETERS) ×3 IMPLANT
CATH ROBINSON RED A/P 16FR (CATHETERS) ×3 IMPLANT
CATH THERMACHOICE III (CATHETERS) ×3 IMPLANT
CLOTH BEACON ORANGE TIMEOUT ST (SAFETY) ×3 IMPLANT
COVER LIGHT HANDLE STERIS (MISCELLANEOUS) ×6 IMPLANT
COVER MAYO STAND XLG (DRAPE) ×3 IMPLANT
DECANTER SPIKE VIAL GLASS SM (MISCELLANEOUS) ×6 IMPLANT
DRESSING COVERLET 3X1 FLEXIBLE (GAUZE/BANDAGES/DRESSINGS) ×6 IMPLANT
ELECT REM PT RETURN 9FT ADLT (ELECTROSURGICAL) ×3
ELECTRODE REM PT RTRN 9FT ADLT (ELECTROSURGICAL) ×2 IMPLANT
FORMALIN 10 PREFIL 120ML (MISCELLANEOUS) ×3 IMPLANT
GLOVE BIOGEL PI IND STRL 7.0 (GLOVE) ×4 IMPLANT
GLOVE BIOGEL PI INDICATOR 7.0 (GLOVE) ×2
GLOVE ECLIPSE 9.0 STRL (GLOVE) ×3 IMPLANT
GLOVE INDICATOR STER SZ 9 (GLOVE) ×3 IMPLANT
GLOVE SS BIOGEL STRL SZ 6.5 (GLOVE) ×2 IMPLANT
GLOVE SUPERSENSE BIOGEL SZ 6.5 (GLOVE) ×1
GOWN STRL REIN 3XL LVL4 (GOWN DISPOSABLE) ×3 IMPLANT
GOWN STRL REIN XL XLG (GOWN DISPOSABLE) ×3 IMPLANT
INST SET HYSTEROSCOPY (KITS) ×3 IMPLANT
INST SET LAPROSCOPIC GYN AP (KITS) ×3 IMPLANT
IV D5W 500ML (IV SOLUTION) ×3 IMPLANT
IV NS IRRIG 3000ML ARTHROMATIC (IV SOLUTION) ×3 IMPLANT
KIT ROOM TURNOVER AP CYSTO (KITS) ×3 IMPLANT
MANIFOLD NEPTUNE II (INSTRUMENTS) ×3 IMPLANT
MANIFOLD NEPTUNE WASTE (CANNULA) ×3 IMPLANT
NEEDLE HYPO 21X1.5 SAFETY (NEEDLE) ×3 IMPLANT
NEEDLE INSUFFLATION 14GA 120MM (NEEDLE) ×3 IMPLANT
NS IRRIG 1000ML POUR BTL (IV SOLUTION) ×3 IMPLANT
PACK PERI GYN (CUSTOM PROCEDURE TRAY) ×3 IMPLANT
PAD ARMBOARD 7.5X6 YLW CONV (MISCELLANEOUS) ×3 IMPLANT
PAD TELFA 3X4 1S STER (GAUZE/BANDAGES/DRESSINGS) ×3 IMPLANT
RING FALLOPIAN BANDS (Ring) ×3 IMPLANT
SET BASIN LINEN APH (SET/KITS/TRAYS/PACK) ×3 IMPLANT
SET IRRIG Y TYPE TUR BLADDER L (SET/KITS/TRAYS/PACK) ×3 IMPLANT
SOL PREP PROV IODINE SCRUB 4OZ (MISCELLANEOUS) ×3 IMPLANT
SOLUTION ANTI FOG 6CC (MISCELLANEOUS) ×3 IMPLANT
STRIP CLOSURE SKIN 1/4X3 (GAUZE/BANDAGES/DRESSINGS) ×3 IMPLANT
SUT VIC AB 4-0 PS2 27 (SUTURE) ×3 IMPLANT
SYR BULB IRRIGATION 50ML (SYRINGE) ×3 IMPLANT
SYR CONTROL 10ML LL (SYRINGE) ×9 IMPLANT
SYRINGE 10CC LL (SYRINGE) ×3 IMPLANT
TROCAR KII 8X100ML NONTHREADED (TROCAR) ×3 IMPLANT
TROCAR Z-THREAD FIOS 5X100MM (TROCAR) ×3 IMPLANT
TUBING INSUFFLATION HIGH FLOW (TUBING) ×3 IMPLANT
WARMER LAPAROSCOPE (MISCELLANEOUS) ×3 IMPLANT

## 2011-12-30 NOTE — Op Note (Signed)
See operative note details included in the brief operative note 

## 2011-12-30 NOTE — Anesthesia Postprocedure Evaluation (Signed)
  Anesthesia Post-op Note  Patient: [name]  Procedure(s) Performed: Procedure(s) (LRB) with comments: LAPAROSCOPY WITH TUBAL LIGATION (Bilateral) - open laparoscopy  with bilateral tubal ligation; end 10:42 DILATATION & CURETTAGE/HYSTEROSCOPY WITH THERMACHOICE ABLATION (N/A) - start 10:45; total time 9 mins, 41 secs; temp 86-88 degrees Celcius; total D5W in 15cc, total out 15cc  Patient Location: PACU  Anesthesia Type:General  Level of Consciousness: awake, alert , oriented and patient cooperative  Airway and Oxygen Therapy: Patient Spontanous Breathing  Post-op Pain: 2 /10, mild  Post-op Assessment: Post-op Vital signs reviewed, Patient's Cardiovascular Status Stable, Respiratory Function Stable, Patent Airway, No signs of Nausea or vomiting and Pain level controlled  Post-op Vital Signs: Reviewed and stable  Complications: No apparent anesthesia complications

## 2011-12-30 NOTE — Interval H&P Note (Signed)
History and Physical Interval Note:  12/30/2011 9:06 AM  [name]  has presented today for surgery, with the diagnosis of sterilization request menometrorrhagia  The various methods of treatment have been discussed with the patient and family. After consideration of risks, benefits and other options for treatment, the patient has consented to  Procedure(s) (LRB) with comments: LAPAROSCOPY WITH TUBAL LIGATION (Bilateral) - open laparoscopy  with bilateral tubal ligation DILATATION & CURETTAGE/HYSTEROSCOPY WITH THERMACHOICE ABLATION (N/A) as a surgical intervention .  The patient's history has been reviewed, patient examined, no change in status, stable for surgery.  I have reviewed the patient's chart and labs.  Questions were answered to the patient's satisfaction.     Tilda Burrow

## 2011-12-30 NOTE — Transfer of Care (Signed)
Immediate Anesthesia Transfer of Care Note  Patient: [name]  Procedure(s) Performed: Procedure(s) (LRB) with comments: LAPAROSCOPY WITH TUBAL LIGATION (Bilateral) - open laparoscopy  with bilateral tubal ligation; end 10:42 DILATATION & CURETTAGE/HYSTEROSCOPY WITH THERMACHOICE ABLATION (N/A) - start 10:45; total time 9 mins, 41 secs; temp 86-88 degrees Celcius; total D5W in 15cc, total out 15cc  Patient Location: PACU  Anesthesia Type:General  Level of Consciousness: awake and patient cooperative  Airway & Oxygen Therapy: Patient Spontanous Breathing and Patient connected to face mask oxygen  Post-op Assessment: Report given to PACU RN, Post -op Vital signs reviewed and stable and Patient moving all extremities  Post vital signs: Reviewed and stable  Complications: No apparent anesthesia complications

## 2011-12-30 NOTE — Progress Notes (Signed)
Coke given to drink. Tolerated well. Denies pain.

## 2011-12-30 NOTE — Anesthesia Procedure Notes (Signed)
Procedure Name: Intubation Date/Time: 12/30/2011 9:55 AM Performed by: Despina Hidden Pre-anesthesia Checklist: Emergency Drugs available, Suction available, Patient identified and Patient being monitored Patient Re-evaluated:Patient Re-evaluated prior to inductionOxygen Delivery Method: Circle system utilized Preoxygenation: Pre-oxygenation with 100% oxygen Intubation Type: IV induction Ventilation: Mask ventilation without difficulty Laryngoscope Size: Mac and 3 Grade View: Grade I Tube type: Oral Tube size: 7.0 mm Number of attempts: 1 Airway Equipment and Method: Stylet Placement Confirmation: ETT inserted through vocal cords under direct vision,  positive ETCO2 and breath sounds checked- equal and bilateral Secured at: 21 cm Tube secured with: Tape Dental Injury: Teeth and Oropharynx as per pre-operative assessment

## 2011-12-30 NOTE — Brief Op Note (Signed)
12/30/2011  11:25 AM  PATIENT:  [name]  36 y.o. female  PRE-OPERATIVE DIAGNOSIS:  sterilization request menometrorrhagia  POST-OPERATIVE DIAGNOSIS:  sterilization request menometrorrhagia  PROCEDURE:  Procedure(s) (LRB) with comments: LAPAROSCOPY WITH TUBAL LIGATION (Bilateral) - open laparoscopy  with bilateral tubal ligation; end 10:42 DILATATION & CURETTAGE/HYSTEROSCOPY WITH THERMACHOICE ABLATION (N/A) - start 10:45; total time 9 mins, 41 secs; temp 86-88 degrees Celcius; total D5W in 15cc, total out 15cc  SURGEON:  Surgeon(s) and Role:    * Tilda Burrow, MD - Primary  PHYSICIAN ASSISTANT:   ASSISTANTS:    ANESTHESIA:   local and general  EBL:  Total I/O In: 1300 [I.V.:1300] Out: -   BLOOD ADMINISTERED:none  DRAINS: none   LOCAL MEDICATIONS USED:  MARCAINE    and Amount: 20 ml  SPECIMEN:  Source of Specimen:  endometrial curettings  DISPOSITION OF SPECIMEN:  PATHOLOGY  COUNTS:  YES  TOURNIQUET:  * No tourniquets in log *  DICTATION: .Dragon Dictation Patient was taken to the operating room, prepped and draped in the parents for combined abdominal and vaginal procedure. Timeout was confirmed by surgical team. No antibiotics were administered as this is not a SCIIP protocol procedure. At catheterization of the bladder was performed amount with sponge stick placed in the vagina for uterine manipulation. An infraumbilical transverse incision was made through the old laparotomy scar on the left of the umbilicus extending approximately 3 cm in a transverse fashion in the subumbilical area. The old scar was fibrotic with skin attached to the underlying fascia and this was freed up 2 cm above and below the the incision. The fascia was identified in the midline just below the umbilicus and carefully opened while elevating it with Allis clamps. The peritoneal cavity was easily entered and there was no evidence of adhesions adjacent to the opening. A Hassan trocar was  placed inside the opening insufflated, and then the rest of the abdomen insufflated through the [facility] cannula without difficulty. Laparoscopic inspection showed essentially no adhesions around the umbilicus or below the umbilicus. There were minimal adhesions on the left side of the midline, but the stomach could be visualized laterally. The abdomen was amazingly clear of intra-abdominal adhesions. Attention was then directed to the pelvis where a suprapubic trocar was placed without difficulty and attention directed to the tubes which were identified on each side to their fimbriated end and elevated and a Falope ring applied to the midportion of each tube Marcaine was injected in the incarcerated knuckle of tube, and the photos taken to document positioning of the Falope-Rings. Placed in the abdomen followed, removal of laparoscopic equipment after placing 240 cc of saline in the abdomen. Umbilical incision was closed at the fascial layer with running 0 Vicryl, and the subcutaneous and subcuticular tissues were reapproximated using 3-0 Vicryl. There was a triangular flap of tissue removed from just below the umbilicus and to the old retracted scar area to reduce the amount of traction at the waist along the line of the previous incision subcuticular skin closure resulted in good tissue edge approximation. The suprapubic trocar was closed subcuticularly with 3-0 Vicryl. The surgeon was repositioned to the vaginal area. legs were elevated and speculum inserted. Cervix was grasped with single-tooth tenaculum, uterus sounded to 9 cm, dilated to 23 [place], and the 30 rigid hysteroscope used to inspect the endometrial which was essentially normal. Smooth sharp curettage obtained small tissue fragments. These were sent for pathology. The Gynecare ThermaChoice 3 endometrial ablation device  was then activated and tested, inserted and filled with 15 cc of D5W, and the 8 minute thermal ablation sequence activated. All 15  cc were recovered. Paracervical block with 20 cc of Marcaine solution was then injected around the cervix and the patient allowed to go to the recovery room in good condition. sponge and needle counts correct PLAN OF CARE: Discharge to home after PACU  PATIENT DISPOSITION:  PACU - hemodynamically stable.   Delay start of Pharmacological VTE agent (>24hrs) due to surgical blood loss or risk of bleeding: not applicable
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
## 2011-12-31 ENCOUNTER — Encounter (HOSPITAL_COMMUNITY): Payer: Self-pay | Admitting: Obstetrics and Gynecology

## 2013-03-29 ENCOUNTER — Other Ambulatory Visit: Payer: Self-pay | Admitting: Adult Health

## 2013-04-08 ENCOUNTER — Ambulatory Visit (INDEPENDENT_AMBULATORY_CARE_PROVIDER_SITE_OTHER): Payer: Self-pay | Admitting: Adult Health

## 2013-04-08 ENCOUNTER — Encounter (INDEPENDENT_AMBULATORY_CARE_PROVIDER_SITE_OTHER): Payer: Self-pay

## 2013-04-08 ENCOUNTER — Other Ambulatory Visit (HOSPITAL_COMMUNITY)
Admission: RE | Admit: 2013-04-08 | Discharge: 2013-04-08 | Disposition: A | Discharge status: Home / Self Care | Payer: Medicaid Other | Source: Ambulatory Visit | Attending: Adult Health | Admitting: Adult Health

## 2013-04-08 ENCOUNTER — Encounter: Payer: Self-pay | Admitting: Adult Health

## 2013-04-08 VITALS — BP 128/78 | HR 76 | Ht 63.5 in | Wt 168.0 lb

## 2013-04-08 DIAGNOSIS — Z124 Encounter for screening for malignant neoplasm of cervix: Secondary | ICD-10-CM | POA: Insufficient documentation

## 2013-04-08 DIAGNOSIS — Z1151 Encounter for screening for human papillomavirus (HPV): Secondary | ICD-10-CM | POA: Insufficient documentation

## 2013-04-08 DIAGNOSIS — Z01419 Encounter for gynecological examination (general) (routine) without abnormal findings: Secondary | ICD-10-CM

## 2013-04-08 DIAGNOSIS — R87810 Cervical high risk human papillomavirus (HPV) DNA test positive: Secondary | ICD-10-CM | POA: Insufficient documentation

## 2013-04-08 NOTE — Patient Instructions (Signed)
Physical in 1 year Mammogram at 40  Calorie Counting Diet A calorie counting diet requires you to eat the number of calories that are right for you in a day. Calories are the measurement of how much energy you get from the food you eat. Eating the right amount of calories is important for staying at a healthy weight. If you eat too many calories, your body will store them as fat and you may gain weight. If you eat too few calories, you may lose weight. Counting the number of calories you eat during a day will help you know if you are eating the right amount. A Registered Dietitian can determine how many calories you need in a day. The amount of calories needed varies from person to person. If your goal is to lose weight, you will need to eat fewer calories. Losing weight can benefit you if you are overweight or have health problems such as heart disease, high blood pressure, or diabetes. If your goal is to gain weight, you will need to eat more calories. Gaining weight may be necessary if you have a certain health problem that causes your body to need more energy. TIPS Whether you are increasing or decreasing the number of calories you eat during a day, it may be hard to get used to changes in what you eat and drink. The following are tips to help you keep track of the number of calories you eat.  Measure foods at home with measuring cups. This helps you know the amount of food and number of calories you are eating.  Restaurants often serve food in amounts that are larger than 1 serving. While eating out, estimate how many servings of a food you are given. For example, a serving of cooked rice is  cup or about the size of half of a fist. Knowing serving sizes will help you be aware of how much food you are eating at restaurants.  Ask for smaller portion sizes or child-size portions at restaurants.  Plan to eat half of a meal at a restaurant. Take the rest home or share the other half with a  friend.  Read the Nutrition Facts panel on food labels for calorie content and serving size. You can find out how many servings are in a package, the size of a serving, and the number of calories each serving has.  For example, a package might contain 3 cookies. The Nutrition Facts panel on that package says that 1 serving is 1 cookie. Below that, it will say there are 3 servings in the container. The calories section of the Nutrition Facts label says there are 90 calories. This means there are 90 calories in 1 cookie (1 serving). If you eat 1 cookie you have eaten 90 calories. If you eat all 3 cookies, you have eaten 270 calories (3 servings x 90 calories = 270 calories). The list below tells you how big or small some common portion sizes are.  1 oz.........4 stacked dice.  3 oz........Marland KitchenDeck of cards.  1 tsp.......Marland KitchenTip of little finger.  1 tbs......Marland KitchenMarland KitchenThumb.  2 tbs.......Marland KitchenGolf ball.   cup......Marland KitchenHalf of a fist.  1 cup.......Marland KitchenA fist. KEEP A FOOD LOG Write down every food item you eat, the amount you eat, and the number of calories in each food you eat during the day. At the end of the day, you can add up the total number of calories you have eaten. It may help to keep a list like the one below.  Find out the calorie information by reading the Nutrition Facts panel on food labels. Breakfast  Bran cereal (1 cup, 110 calories).  Fat-free milk ( cup, 45 calories). Snack  Apple (1 medium, 80 calories). Lunch  Spinach (1 cup, 20 calories).  Tomato ( medium, 20 calories).  Chicken breast strips (3 oz, 165 calories).  Shredded cheddar cheese ( cup, 110 calories).  Light [place] dressing (2 tbs, 60 calories).  Whole-wheat bread (1 slice, 80 calories).  Tub margarine (1 tsp, 35 calories).  Vegetable soup (1 cup, 160 calories). Dinner  Pork chop (3 oz, 190 calories).  Brown rice (1 cup, 215 calories).  Steamed broccoli ( cup, 20 calories).  Strawberries (1  cup, 65  calories).  Whipped cream (1 tbs, 50 calories). Daily Calorie Total: 2751 Document Released: 01/20/2005 Document Revised: 04/14/2011 Document Reviewed: 07/17/2006 [facility] Patient Information 2014 [place].
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
## 2013-04-19 ENCOUNTER — Telehealth: Payer: Self-pay | Admitting: Adult Health

## 2013-04-19 NOTE — Telephone Encounter (Signed)
Pt aware pap abnormal with +HPV will schedule appt with Dr Glo Herring for colpo and ECC

## 2013-05-05 ENCOUNTER — Encounter: Payer: Self-pay | Admitting: Obstetrics and Gynecology

## 2013-05-23 ENCOUNTER — Encounter: Payer: Self-pay | Admitting: Obstetrics and Gynecology

## 2013-05-23 ENCOUNTER — Ambulatory Visit (INDEPENDENT_AMBULATORY_CARE_PROVIDER_SITE_OTHER): Payer: BC Managed Care – PPO | Admitting: Obstetrics and Gynecology

## 2013-05-23 VITALS — BP 118/78 | Ht 63.5 in | Wt 161.0 lb

## 2013-05-23 DIAGNOSIS — Z3202 Encounter for pregnancy test, result negative: Secondary | ICD-10-CM

## 2013-05-23 DIAGNOSIS — R87619 Unspecified abnormal cytological findings in specimens from cervix uteri: Secondary | ICD-10-CM

## 2013-05-23 DIAGNOSIS — R87810 Cervical high risk human papillomavirus (HPV) DNA test positive: Secondary | ICD-10-CM

## 2013-05-23 LAB — POCT URINE PREGNANCY: Preg Test, Ur: NEGATIVE

## 2013-05-23 NOTE — Progress Notes (Signed)
This chart was scribed by Ludger Nutting, Medical Scribe, for Dr. Mallory Shirk on 05/23/13 at 11:57 AM. This chart was reviewed by Dr. Mallory Shirk and is accurate.   Channing Savich 37 y.o. [id number] here for colposcopy for atypical glandular cells  pap smear on 04/08/13.  Discussed role for HPV in cervical dysplasia, need for surveillance.  Patient given informed consent, signed copy in the chart, time out was performed.  Placed in lithotomy position. Cervix viewed with speculum and colposcope after application of acetic acid.   Colposcopy adequate? Yes  no visible lesions; biopsies none.   ECC specimen obtained. All specimens were labelled and sent to pathology. Patient had endometrial biopsy done 2 years ago at time of endometrial ablation.   Colposcopy IMPRESSION:  Patient was given post procedure instructions. Will follow up pathology and manage accordingly.  Routine preventative health maintenance measures emphasized.

## 2013-05-23 NOTE — Patient Instructions (Signed)
Colposcopy Care After Colposcopy is a procedure in which a special tool is used to magnify the surface of the cervix. A tissue sample (biopsy) may also be taken. This sample will be looked at for cervical cancer or other problems. After the test:  You may have some cramping.  Lie down for a few minutes if you feel lightheaded.   You may have some bleeding which should stop in a few days. HOME CARE  Do not have sex or use tampons for 2 to 3 days or as told.  Only take medicine as told by your doctor.  Continue to take your birth control pills as usual. Finding out the results of your test Ask when your test results will be ready. Make sure you get your test results. GET HELP RIGHT AWAY IF:  You are bleeding a lot or are passing blood clots.  You develop a fever of 102 F (38.9 C) or higher.  You have abnormal vaginal discharge.  You have cramps that do not go away with medicine.  You feel lightheaded, dizzy, or pass out (faint). MAKE SURE YOU:   Understand these instructions.  Will watch your condition.  Will get help right away if you are not doing well or get worse. Document Released: 07/09/2007 Document Revised: 04/14/2011 Document Reviewed: 08/19/2012 [employer] Patient Information 2014 [employer].  

## 2013-05-31 ENCOUNTER — Telehealth: Payer: Self-pay | Admitting: Obstetrics and Gynecology

## 2013-05-31 NOTE — Telephone Encounter (Signed)
Chrystal scheduled pt with [facility] and I faxed order

## 2013-05-31 NOTE — Telephone Encounter (Signed)
Previous note was entered into wrong pt chart.   Pt aware of results.

## 2013-08-30 ENCOUNTER — Telehealth: Payer: Self-pay | Admitting: Adult Health

## 2013-08-30 NOTE — Telephone Encounter (Signed)
Called pt she has not had colpo yet, for abnormal pap in March, and she said it slipped her mind, will schedule now.Colpo scheduled for 8/21 at 11 am with Dr Glo Herring.

## 2013-09-23 ENCOUNTER — Encounter: Payer: BC Managed Care – PPO | Admitting: Obstetrics and Gynecology

## 2013-09-30 ENCOUNTER — Ambulatory Visit (INDEPENDENT_AMBULATORY_CARE_PROVIDER_SITE_OTHER): Payer: BC Managed Care – PPO | Admitting: Obstetrics and Gynecology

## 2013-09-30 ENCOUNTER — Other Ambulatory Visit: Payer: Self-pay | Admitting: Obstetrics and Gynecology

## 2013-09-30 ENCOUNTER — Encounter: Payer: Self-pay | Admitting: Obstetrics and Gynecology

## 2013-09-30 VITALS — BP 130/90 | Ht 63.0 in | Wt 149.0 lb

## 2013-09-30 DIAGNOSIS — Z3200 Encounter for pregnancy test, result unknown: Secondary | ICD-10-CM

## 2013-09-30 DIAGNOSIS — D069 Carcinoma in situ of cervix, unspecified: Secondary | ICD-10-CM

## 2013-09-30 DIAGNOSIS — R87629 Unspecified abnormal cytological findings in specimens from vagina: Secondary | ICD-10-CM | POA: Insufficient documentation

## 2013-09-30 DIAGNOSIS — Z3202 Encounter for pregnancy test, result negative: Secondary | ICD-10-CM

## 2013-09-30 LAB — POCT URINE PREGNANCY: Preg Test, Ur: NEGATIVE

## 2013-09-30 NOTE — Progress Notes (Signed)
  [name] 38 y.o. [id number] here for colposcopy for +HPV and atypical grandular cells. pap smear on 04/08/13.  Discussed role for HPV in cervical dysplasia, need for surveillance.  Patient given informed consent, signed copy in the chart, time out was performed.  Placed in lithotomy position. Cervix viewed with speculum and colposcope after application of acetic acid.   Colposcopy adequate? Yes  no visible lesions; biopsies obtained at [facility] and random.   ECC specimen obtained. All specimens were labelled and sent to pathology.  Colposcopy IMPRESSION: no visible dysplasia,                                                  Atypical glandular cells on recent pap  Patient was given post procedure instructions. Will follow up pathology and manage accordingly.  Routine preventative health maintenance measures emphasized.

## 2013-09-30 NOTE — Patient Instructions (Signed)
Please sign up for My Chart. Results will be posted in My chart within a week If not visible in my Chart by next week , call office for results Followup is currently planned for Pap  In one year.

## 2013-10-04 ENCOUNTER — Telehealth: Payer: Self-pay | Admitting: *Deleted

## 2013-10-04 ENCOUNTER — Telehealth: Payer: Self-pay | Admitting: Obstetrics and Gynecology

## 2013-10-04 NOTE — Telephone Encounter (Signed)
Pt informed of ECC and cervical biopsy results from 09/30/2013 (Moderate/Severe Dysplasia) CIN III. All patient questions answered in regards to results and procedures. Pt has an appt for 10/12/2013 to discuss plan with Dr. Glo Herring.

## 2013-10-04 NOTE — Telephone Encounter (Signed)
Pt discussed results with Chrystal and has an appointment to follow up with [facility].

## 2013-10-12 ENCOUNTER — Other Ambulatory Visit: Payer: Self-pay | Admitting: Obstetrics and Gynecology

## 2013-10-12 ENCOUNTER — Encounter: Payer: Self-pay | Admitting: Obstetrics and Gynecology

## 2013-10-12 ENCOUNTER — Ambulatory Visit (INDEPENDENT_AMBULATORY_CARE_PROVIDER_SITE_OTHER): Payer: BC Managed Care – PPO | Admitting: Obstetrics and Gynecology

## 2013-10-12 VITALS — BP 98/64 | Ht 63.0 in | Wt 150.4 lb

## 2013-10-12 DIAGNOSIS — Z3202 Encounter for pregnancy test, result negative: Secondary | ICD-10-CM

## 2013-10-12 DIAGNOSIS — D069 Carcinoma in situ of cervix, unspecified: Secondary | ICD-10-CM

## 2013-10-12 LAB — POCT URINE PREGNANCY: Preg Test, Ur: NEGATIVE

## 2013-10-12 NOTE — Progress Notes (Signed)
GYNECOLOGY CLINIC PROCEDURE NOTE  [name] is a 38 y.o. [id number] here for LEEP. No GYN concerns. Pap smear and colposcopy reviewed.    Gynecologic exam 04/18/13 Colpo Biopsy 05/23/13 and 09/30/13:SQUAMOUS ATYPIA. ECC 09/30/13: SQUAMOUS FRAGMENTS WITH HIGH GRADE SQUAMOUS INTRAEPITHELIAL LESION, CIN-III (MODERATE/SEVERE DYSPLASIA). - ENDOCERVICAL GLANDS WITH ATYPIA.  Risks, benefits, alternatives, and limitations of procedure explained to patient, including pain, bleeding, infection, failure to remove abnormal tissue and failure to cure dysplasia, need for repeat procedures, damage to pelvic organs, cervical incompetence.  Role of HPV,cervical dysplasia and need for close followup was empasized. Informed written consent was obtained. All questions were answered. Time out performed.  ??Procedure: The patient was placed in lithotomy position and the bivalved coated speculum was placed in the patient's vagina. A grounding pad placed on the patient. Lugol's solution was applied to the cervix and areas of decreased uptake were noted around the transformation zone.   Local anesthesia was administered via an intracervical block using 10cc of 2% Lidocaine without epinephrine. The suction was turned on and the Large on 44 Watts of cutting current was used to excise the area of decreased uptake and excise the entire transformation zone. Excellent hemostasis was achieved using roller ball coagulation set at 50 Watts coagulation current. Monsel's solution was then applied and the speculum was removed from the vagina. Specimens were sent to pathology.  ?The patient tolerated the procedure well. Post-operative instructions given to patient, including instruction to seek medical attention for persistent bright red bleeding, fever, abdominal/pelvic pain, dysuria, nausea or vomiting. She was also told about the possibility of having copious yellow to black tinged discharge for weeks. She was counseled to avoid anything  in the vagina (sex/douching/tampons) for 3 weeks. She has a 4 week post-operative check to assess wound healing, review results and discuss further management.    Pt voices concern about the abnormal cells and if there is more information about it. She states that her mother had the same issue.   A:  1. Concerns about the abnormal cells were addressed with pt.  2. Colpo Biopsy 05/23/13 and 09/30/13: SQUAMOUS ATYPIA. ECC 09/30/13: SQUAMOUS FRAGMENTS WITH HIGH GRADE SQUAMOUS INTRAEPITHELIAL LESION, CIN-III (MODERATE/SEVERE DYSPLASIA). - ENDOCERVICAL GLANDS WITH ATYPIA.  3. Moderate severe dysplasia  P:  1. F/U in two weeks for discussion of results and exam.    This chart was scribed by Steva Colder, Medical Scribe, for Dr. Mallory Shirk on 10/12/13 at 10:23 AM. This chart was reviewed by Dr. Mallory Shirk for accuracy.

## 2013-10-12 NOTE — Patient Instructions (Addendum)
LEEP POST-PROCEDURE INSTRUCTIONS  1. You may take Ibuprofen, Aleve or Tylenol for pain if needed.  Cramping is normal.  2. You will have black and/or bloody discharge at first.  This will lighten and then turn clear before completely resolving.  This will take 2 to 3 weeks.  3. Put nothing in your vagina until the bleeding or discharge stops (usually 2 or3 days). No sexual activity.   4. You need to call if you have redness around the biopsy site, if there is any unusual draining, if the bleeding is heavy, or if you are concerned.  5. Shower or bathe as normal  6. We will call you within one week with results or we will discuss the results at your follow-up appointment if needed.  7. You will need to return for a follow-up Pap smear as directed by your physician.

## 2013-10-25 ENCOUNTER — Telehealth: Payer: Self-pay | Admitting: Adult Health

## 2013-10-26 ENCOUNTER — Encounter: Payer: Self-pay | Admitting: Obstetrics and Gynecology

## 2013-10-26 ENCOUNTER — Ambulatory Visit (INDEPENDENT_AMBULATORY_CARE_PROVIDER_SITE_OTHER): Payer: BC Managed Care – PPO | Admitting: Obstetrics and Gynecology

## 2013-10-26 VITALS — BP 126/70 | Ht 63.0 in | Wt 149.0 lb

## 2013-10-26 DIAGNOSIS — D069 Carcinoma in situ of cervix, unspecified: Secondary | ICD-10-CM
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
## 2013-10-26 NOTE — Progress Notes (Signed)
Patient ID: [name], female   DOB: 04/03/1975, 38 y.o.   MRN: [id number] Pt here today to follow up from her LEEP. Pt here to go over results of LEEP. Pt denies any problems or concerns at this time.

## 2013-10-26 NOTE — Telephone Encounter (Signed)
Seen today and set up with Amy barnes for subsequent surgery

## 2013-10-27 ENCOUNTER — Encounter (HOSPITAL_COMMUNITY): Payer: Self-pay | Admitting: Pharmacy Technician

## 2013-11-09 ENCOUNTER — Encounter: Payer: Self-pay | Admitting: Obstetrics and Gynecology

## 2013-11-09 ENCOUNTER — Ambulatory Visit (INDEPENDENT_AMBULATORY_CARE_PROVIDER_SITE_OTHER): Payer: BC Managed Care – PPO | Admitting: Obstetrics and Gynecology

## 2013-11-09 VITALS — BP 120/80 | Ht 63.0 in | Wt 150.0 lb

## 2013-11-09 DIAGNOSIS — D069 Carcinoma in situ of cervix, unspecified: Secondary | ICD-10-CM

## 2013-11-09 DIAGNOSIS — N812 Incomplete uterovaginal prolapse: Secondary | ICD-10-CM

## 2013-11-09 NOTE — Progress Notes (Signed)
Patient ID: [name], female   DOB: 1975-08-20, 38 y.o.   MRN: [id number] Pt here today for Pre op, Vaginal Hysterectomy.
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
## 2013-11-09 NOTE — Progress Notes (Deleted)
Duplicate

## 2013-11-22 ENCOUNTER — Other Ambulatory Visit (HOSPITAL_COMMUNITY): Payer: Medicaid Other

## 2013-11-22 ENCOUNTER — Other Ambulatory Visit: Payer: Self-pay | Admitting: Obstetrics and Gynecology

## 2013-11-22 NOTE — Patient Instructions (Signed)
[name]  11/22/2013   Your procedure is scheduled on:  11/29/13   Report to [facility] at 6:15 AM.  Call this number if you have problems the morning of surgery: [phone]   Remember:   Do not eat food or drink liquids after midnight.   Take these medicines the morning of surgery with A SIP OF WATER: Valium   Do not wear jewelry, make-up or nail polish.  Do not wear lotions, powders, or perfumes. You may wear deodorant.  Do not shave 48 hours prior to surgery. Men may shave face and neck.  Do not bring valuables to the hospital.  [facility] is not responsible for any belongings or valuables.               Contacts, dentures or bridgework may not be worn into surgery.  Leave suitcase in the car. After surgery it may be brought to your room.  For patients admitted to the hospital, discharge time is determined by your                treatment team.               Patients discharged the day of surgery will not be allowed to drive home.  Name and phone number of your driver:   Special Instructions: Shower using CHG 2 nights before surgery and the night before surgery.  If you shower the day of surgery use CHG.  Use special wash - you have one bottle of CHG for all showers.  You should use approximately 1/3 of the bottle for each shower.   Please read over the following fact sheets that you were given: Surgical Site Infection Prevention and Anesthesia Post-op Instructions   PATIENT INSTRUCTIONS POST-ANESTHESIA  IMMEDIATELY FOLLOWING SURGERY:  Do not drive or operate machinery for the first twenty four hours after surgery.  Do not make any important decisions for twenty four hours after surgery or while taking narcotic pain medications or sedatives.  If you develop intractable nausea and vomiting or a severe headache please notify your doctor immediately.  FOLLOW-UP:  Please make an appointment with your surgeon as instructed. You do not need to follow up with anesthesia unless  specifically instructed to do so.  WOUND CARE INSTRUCTIONS (if applicable):  Keep a dry clean dressing on the anesthesia/puncture wound site if there is drainage.  Once the wound has quit draining you may leave it open to air.  Generally you should leave the bandage intact for twenty four hours unless there is drainage.  If the epidural site drains for more than 36-48 hours please call the anesthesia department.  QUESTIONS?:  Please feel free to call your physician or the hospital operator if you have any questions, and they will be happy to assist you.      Hysterectomy Information  A hysterectomy is a surgery in which your uterus is removed. This surgery may be done to treat various medical problems. After the surgery, you will no longer have menstrual periods. The surgery will also make you unable to become pregnant (sterile). The fallopian tubes and ovaries can be removed (bilateral salpingo-oophorectomy) during this surgery as well.  REASONS FOR A HYSTERECTOMY  Persistent, abnormal bleeding.  Lasting (chronic) pelvic pain or infection.  The lining of the uterus (endometrium) starts growing outside the uterus (endometriosis).  The endometrium starts growing in the muscle of the uterus (adenomyosis).  The uterus falls down into the vagina (pelvic organ prolapse).  Noncancerous  growths in the uterus (uterine fibroids) that cause symptoms.  Precancerous cells.  Cervical cancer or uterine cancer. TYPES OF HYSTERECTOMIES  Supracervical hysterectomy--In this type, the top part of the uterus is removed, but not the cervix.  Total hysterectomy--The uterus and cervix are removed.  Radical hysterectomy--The uterus, the cervix, and the fibrous tissue that holds the uterus in place in the pelvis (parametrium) are removed. WAYS A HYSTERECTOMY CAN BE PERFORMED  Abdominal hysterectomy--A large surgical cut (incision) is made in the abdomen. The uterus is removed through this  incision.  Vaginal hysterectomy--An incision is made in the vagina. The uterus is removed through this incision. There are no abdominal incisions.  Conventional laparoscopic hysterectomy--Three or four small incisions are made in the abdomen. A thin, lighted tube with a camera (laparoscope) is inserted into one of the incisions. Other tools are put through the other incisions. The uterus is cut into small pieces. The small pieces are removed through the incisions, or they are removed through the vagina.  Laparoscopically assisted vaginal hysterectomy (LAVH)--Three or four small incisions are made in the abdomen. Part of the surgery is performed laparoscopically and part vaginally. The uterus is removed through the vagina.  Robot-assisted laparoscopic hysterectomy--A laparoscope and other tools are inserted into 3 or 4 small incisions in the abdomen. A computer-controlled device is used to give the surgeon a 3D image and to help control the surgical instruments. This allows for more precise movements of surgical instruments. The uterus is cut into small pieces and removed through the incisions or removed through the vagina. RISKS AND COMPLICATIONS  Possible complications associated with this procedure include:  Bleeding and risk of blood transfusion. Tell your health care provider if you do not want to receive any blood products.  Blood clots in the legs or lung.  Infection.  Injury to surrounding organs.  Problems or side effects related to anesthesia.  Conversion to an abdominal hysterectomy from one of the other techniques. WHAT TO EXPECT AFTER A HYSTERECTOMY  You will be given pain medicine.  You will need to have someone with you for the first 3-5 days after you go home.  You will need to follow up with your surgeon in 2-4 weeks after surgery to evaluate your progress.  You may have early menopause symptoms such as hot flashes, night sweats, and insomnia.  If you had a  hysterectomy for a problem that was not cancer or not a condition that could lead to cancer, then you no longer need Pap tests. However, even if you no longer need a Pap test, a regular exam is a good idea to make sure no other problems are starting. Document Released: 07/16/2000 Document Revised: 11/10/2012 Document Reviewed: 09/27/2012 [facility] Patient Information 2015 [place], [place]. This information is not intended to replace advice given to you by your health care provider. Make sure you discuss any questions you have with your health care provider.

## 2013-11-23 ENCOUNTER — Telehealth: Payer: Self-pay | Admitting: Obstetrics and Gynecology

## 2013-11-23 ENCOUNTER — Encounter (HOSPITAL_COMMUNITY)
Admission: RE | Admit: 2013-11-23 | Discharge: 2013-11-23 | Disposition: A | Discharge status: Home / Self Care | Payer: BC Managed Care – PPO | Source: Ambulatory Visit | Attending: Obstetrics and Gynecology | Admitting: Obstetrics and Gynecology

## 2013-11-23 ENCOUNTER — Encounter (HOSPITAL_COMMUNITY): Payer: Self-pay

## 2013-11-23 DIAGNOSIS — Z01812 Encounter for preprocedural laboratory examination: Secondary | ICD-10-CM | POA: Diagnosis present

## 2013-11-23 DIAGNOSIS — N816 Rectocele: Secondary | ICD-10-CM | POA: Diagnosis not present

## 2013-11-23 DIAGNOSIS — Z888 Allergy status to other drugs, medicaments and biological substances status: Secondary | ICD-10-CM | POA: Diagnosis not present

## 2013-11-23 DIAGNOSIS — Z882 Allergy status to sulfonamides status: Secondary | ICD-10-CM | POA: Diagnosis not present

## 2013-11-23 DIAGNOSIS — Z885 Allergy status to narcotic agent status: Secondary | ICD-10-CM | POA: Diagnosis not present

## 2013-11-23 DIAGNOSIS — Z8041 Family history of malignant neoplasm of ovary: Secondary | ICD-10-CM | POA: Diagnosis not present

## 2013-11-23 DIAGNOSIS — D069 Carcinoma in situ of cervix, unspecified: Secondary | ICD-10-CM | POA: Diagnosis present

## 2013-11-23 DIAGNOSIS — F17210 Nicotine dependence, cigarettes, uncomplicated: Secondary | ICD-10-CM | POA: Diagnosis not present

## 2013-11-23 DIAGNOSIS — Z880 Allergy status to penicillin: Secondary | ICD-10-CM | POA: Diagnosis not present

## 2013-11-23 DIAGNOSIS — N8110 Cystocele, unspecified: Secondary | ICD-10-CM | POA: Diagnosis not present

## 2013-11-23 DIAGNOSIS — N8189 Other female genital prolapse: Secondary | ICD-10-CM | POA: Diagnosis not present

## 2013-11-23 LAB — URINALYSIS, ROUTINE W REFLEX MICROSCOPIC
Bilirubin Urine: NEGATIVE
Glucose, UA: NEGATIVE mg/dL
KETONES UR: NEGATIVE mg/dL
Leukocytes, UA: NEGATIVE
Nitrite: NEGATIVE
PROTEIN: NEGATIVE mg/dL
Specific Gravity, Urine: <1.005 — ABNORMAL LOW (ref 1.005–1.030)
UROBILINOGEN UA: 0.2 mg/dL (ref 0.0–1.0)
pH: 6.5 (ref 5.0–8.0)

## 2013-11-23 LAB — COMPREHENSIVE METABOLIC PANEL
ALT: 10 U/L (ref 0–35)
ANION GAP: 12 (ref 5–15)
AST: 15 U/L (ref 0–37)
Albumin: 4 g/dL (ref 3.5–5.2)
Alkaline Phosphatase: 58 U/L (ref 39–117)
BUN: 8 mg/dL (ref 6–23)
CALCIUM: 9.7 mg/dL (ref 8.4–10.5)
CO2: 25 meq/L (ref 19–32)
Chloride: 105 mEq/L (ref 96–112)
Creatinine, Ser: 0.66 mg/dL (ref 0.50–1.10)
GLUCOSE: 74 mg/dL (ref 70–99)
Potassium: 3.9 mEq/L (ref 3.7–5.3)
SODIUM: 142 meq/L (ref 137–147)
Total Bilirubin: 0.3 mg/dL (ref 0.3–1.2)
Total Protein: 7.1 g/dL (ref 6.0–8.3)

## 2013-11-23 LAB — CBC
HCT: 39.6 % (ref 36.0–46.0)
HEMOGLOBIN: 12.8 g/dL (ref 12.0–15.0)
MCH: 28.4 pg (ref 26.0–34.0)
MCHC: 32.3 g/dL (ref 30.0–36.0)
MCV: 88 fL (ref 78.0–100.0)
Platelets: 261 10*3/uL (ref 150–400)
RBC: 4.5 MIL/uL (ref 3.87–5.11)
RDW: 13.9 % (ref 11.5–15.5)
WBC: 7.6 10*3/uL (ref 4.0–10.5)

## 2013-11-23 LAB — URINE MICROSCOPIC-ADD ON

## 2013-11-23 LAB — HCG, SERUM, QUALITATIVE: Preg, Serum: NEGATIVE

## 2013-11-23 NOTE — Pre-Procedure Instructions (Signed)
Patient given information to sign up for my chart at home. 

## 2013-11-23 NOTE — Pre-Procedure Instructions (Signed)
Patient in for PAT. Booking states that she is to have Vag hysterectomy and nothing else. Obtain consent stated Vaginal hysterectomy plus anterior and posterior repair. Patient states that her mother died at age 38 with ovarian cancer and thought that she was also having her ovaries and tubes removed. Called Dr Glo Herring who spoke with patient. They both agree that procedure should be changed to laparoscopic assisted vaginal hysterectomy with Bilateral salpingooohorectomy and anterior and posterior repair. Called OR scheduler to have booking changed and obtain consent order was also changed to match the above. Patient was also Typed and screened per new order by Dr Glo Herring.

## 2013-11-23 NOTE — Telephone Encounter (Signed)
Pt call completed to day surgery. Pt reports a history of Ovarian cancer premenopausal in her mother, and she wants ovaries removed as a part of surgery. In order to insure adequate access to ovaries, will perform case as a Laparoscopic assisted Vaginal hysterectomy, with bilateral Salpingoophorectomy, and anterior and posterior repair.
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
## 2013-11-29 ENCOUNTER — Observation Stay (HOSPITAL_COMMUNITY)
Admission: RE | Admit: 2013-11-29 | Discharge: 2013-11-29 | Disposition: A | Discharge status: Home / Self Care | Payer: BC Managed Care – PPO | Source: Ambulatory Visit | Attending: Obstetrics and Gynecology | Admitting: Obstetrics and Gynecology

## 2013-11-29 ENCOUNTER — Ambulatory Visit (HOSPITAL_COMMUNITY): Payer: BC Managed Care – PPO | Admitting: Anesthesiology

## 2013-11-29 ENCOUNTER — Encounter (HOSPITAL_COMMUNITY): Payer: Self-pay

## 2013-11-29 ENCOUNTER — Encounter (HOSPITAL_COMMUNITY): Payer: BC Managed Care – PPO | Admitting: Anesthesiology

## 2013-11-29 ENCOUNTER — Encounter (HOSPITAL_COMMUNITY)
Admission: RE | Disposition: A | Discharge status: Home / Self Care | Payer: Self-pay | Source: Ambulatory Visit | Attending: Obstetrics and Gynecology

## 2013-11-29 DIAGNOSIS — N8189 Other female genital prolapse: Secondary | ICD-10-CM

## 2013-11-29 DIAGNOSIS — Z882 Allergy status to sulfonamides status: Secondary | ICD-10-CM | POA: Insufficient documentation

## 2013-11-29 DIAGNOSIS — N816 Rectocele: Secondary | ICD-10-CM

## 2013-11-29 DIAGNOSIS — D259 Leiomyoma of uterus, unspecified: Secondary | ICD-10-CM

## 2013-11-29 DIAGNOSIS — D069 Carcinoma in situ of cervix, unspecified: Principal | ICD-10-CM | POA: Insufficient documentation

## 2013-11-29 DIAGNOSIS — Z880 Allergy status to penicillin: Secondary | ICD-10-CM | POA: Insufficient documentation

## 2013-11-29 DIAGNOSIS — N831 Corpus luteum cyst: Secondary | ICD-10-CM

## 2013-11-29 DIAGNOSIS — Z885 Allergy status to narcotic agent status: Secondary | ICD-10-CM | POA: Insufficient documentation

## 2013-11-29 DIAGNOSIS — F17210 Nicotine dependence, cigarettes, uncomplicated: Secondary | ICD-10-CM | POA: Insufficient documentation

## 2013-11-29 DIAGNOSIS — Z8041 Family history of malignant neoplasm of ovary: Secondary | ICD-10-CM | POA: Insufficient documentation

## 2013-11-29 DIAGNOSIS — N8110 Cystocele, unspecified: Secondary | ICD-10-CM

## 2013-11-29 DIAGNOSIS — N736 Female pelvic peritoneal adhesions (postinfective): Secondary | ICD-10-CM

## 2013-11-29 DIAGNOSIS — Z90710 Acquired absence of both cervix and uterus: Secondary | ICD-10-CM

## 2013-11-29 DIAGNOSIS — Z888 Allergy status to other drugs, medicaments and biological substances status: Secondary | ICD-10-CM | POA: Insufficient documentation

## 2013-11-29 DIAGNOSIS — N830 Follicular cyst of ovary: Secondary | ICD-10-CM

## 2013-11-29 HISTORY — PX: ANTERIOR AND POSTERIOR REPAIR: SHX5121

## 2013-11-29 HISTORY — DX: Anxiety disorder, unspecified: F41.9

## 2013-11-29 HISTORY — PX: LAPAROSCOPIC ASSISTED VAGINAL HYSTERECTOMY: SHX5398

## 2013-11-29 HISTORY — PX: LAPAROSCOPIC BILATERAL SALPINGO OOPHERECTOMY: SHX5890

## 2013-11-29 SURGERY — HYSTERECTOMY, VAGINAL, LAPAROSCOPY-ASSISTED
Anesthesia: General

## 2013-11-29 MED ORDER — IBUPROFEN 600 MG PO TABS: 600.0000 mg | ORAL_TABLET | Freq: Four times a day (QID) | ORAL | Status: DC | PRN

## 2013-11-29 MED ORDER — LACTATED RINGERS IV SOLN
INTRAVENOUS | Status: DC
  Administered 2013-11-29 (×4): via INTRAVENOUS

## 2013-11-29 MED ORDER — ONDANSETRON HCL 4 MG PO TABS: 4.0000 mg | ORAL_TABLET | Freq: Four times a day (QID) | ORAL | Status: DC | PRN

## 2013-11-29 MED ORDER — ROCURONIUM BROMIDE 50 MG/5ML IV SOLN
INTRAVENOUS | Status: AC
  Filled 2013-11-29: qty 1

## 2013-11-29 MED ORDER — DIPHENHYDRAMINE HCL 50 MG/ML IJ SOLN: 12.5000 mg | Freq: Four times a day (QID) | INTRAMUSCULAR | Status: DC | PRN

## 2013-11-29 MED ORDER — OXYCODONE-ACETAMINOPHEN 5-325 MG PO TABS
1.0000 | ORAL_TABLET | ORAL | Status: DC | PRN
  Administered 2013-11-29: 2 via ORAL
  Filled 2013-11-29: qty 2

## 2013-11-29 MED ORDER — CLINDAMYCIN PHOSPHATE 900 MG/50ML IV SOLN
INTRAVENOUS | Status: DC | PRN
  Administered 2013-11-29: 900 mg via INTRAVENOUS

## 2013-11-29 MED ORDER — HYDROMORPHONE 0.3 MG/ML IV SOLN
INTRAVENOUS | Status: DC
  Administered 2013-11-29: 12:00:00 via INTRAVENOUS
  Filled 2013-11-29: qty 25

## 2013-11-29 MED ORDER — ESTRADIOL 0.1 MG/24HR TD PTTW: 1.0000 | MEDICATED_PATCH | TRANSDERMAL | Status: DC

## 2013-11-29 MED ORDER — SEVOFLURANE IN SOLN
RESPIRATORY_TRACT | Status: AC
  Filled 2013-11-29: qty 250

## 2013-11-29 MED ORDER — FENTANYL CITRATE 0.05 MG/ML IJ SOLN
25.0000 ug | INTRAMUSCULAR | Status: DC | PRN
  Administered 2013-11-29 (×2): 50 ug via INTRAVENOUS
  Filled 2013-11-29: qty 2

## 2013-11-29 MED ORDER — NALOXONE HCL 0.4 MG/ML IJ SOLN: 0.4000 mg | INTRAMUSCULAR | Status: DC | PRN

## 2013-11-29 MED ORDER — ONDANSETRON HCL 4 MG/2ML IJ SOLN
INTRAMUSCULAR | Status: AC
  Filled 2013-11-29: qty 2

## 2013-11-29 MED ORDER — OXYCODONE-ACETAMINOPHEN 5-325 MG PO TABS: 1.0000 | ORAL_TABLET | ORAL | Status: DC | PRN

## 2013-11-29 MED ORDER — FENTANYL CITRATE 0.05 MG/ML IJ SOLN
25.0000 ug | INTRAMUSCULAR | Status: DC
  Administered 2013-11-29: 25 ug via INTRAVENOUS

## 2013-11-29 MED ORDER — SODIUM CHLORIDE 0.9 % IV SOLN
INTRAVENOUS | Status: DC
  Administered 2013-11-29: 14:00:00 via INTRAVENOUS

## 2013-11-29 MED ORDER — SODIUM CHLORIDE 0.9 % IJ SOLN: 9.0000 mL | INTRAMUSCULAR | Status: DC | PRN

## 2013-11-29 MED ORDER — MIDAZOLAM HCL 2 MG/2ML IJ SOLN
INTRAMUSCULAR | Status: AC
  Filled 2013-11-29: qty 2

## 2013-11-29 MED ORDER — DIPHENHYDRAMINE HCL 12.5 MG/5ML PO ELIX
12.5000 mg | ORAL_SOLUTION | Freq: Four times a day (QID) | ORAL | Status: DC | PRN
Start: 2013-11-29 — End: 2013-11-29

## 2013-11-29 MED ORDER — PROPOFOL 10 MG/ML IV BOLUS
INTRAVENOUS | Status: DC | PRN
  Administered 2013-11-29: 150 mg via INTRAVENOUS

## 2013-11-29 MED ORDER — CLINDAMYCIN PHOSPHATE 900 MG/50ML IV SOLN: 900.0000 mg | Freq: Once | INTRAVENOUS | Status: AC

## 2013-11-29 MED ORDER — ROCURONIUM BROMIDE 100 MG/10ML IV SOLN
INTRAVENOUS | Status: DC | PRN
  Administered 2013-11-29: 10 mg via INTRAVENOUS
  Administered 2013-11-29 (×2): 20 mg via INTRAVENOUS
  Administered 2013-11-29: 50 mg via INTRAVENOUS

## 2013-11-29 MED ORDER — MIDAZOLAM HCL 5 MG/5ML IJ SOLN
INTRAMUSCULAR | Status: DC | PRN
  Administered 2013-11-29: 2 mg via INTRAVENOUS

## 2013-11-29 MED ORDER — KETOROLAC TROMETHAMINE 30 MG/ML IJ SOLN
30.0000 mg | Freq: Once | INTRAMUSCULAR | Status: AC
  Administered 2013-11-29: 30 mg via INTRAVENOUS
  Filled 2013-11-29: qty 1

## 2013-11-29 MED ORDER — BUPIVACAINE-EPINEPHRINE 0.5% -1:200000 IJ SOLN
INTRAMUSCULAR | Status: DC | PRN
Start: 2013-11-29 — End: 2013-11-29
  Administered 2013-11-29: 17 mL

## 2013-11-29 MED ORDER — GLYCOPYRROLATE 0.2 MG/ML IJ SOLN
INTRAMUSCULAR | Status: AC
  Filled 2013-11-29: qty 1

## 2013-11-29 MED ORDER — KETOROLAC TROMETHAMINE 30 MG/ML IJ SOLN: 30.0000 mg | Freq: Four times a day (QID) | INTRAMUSCULAR | Status: DC

## 2013-11-29 MED ORDER — ONDANSETRON HCL 4 MG/2ML IJ SOLN
4.0000 mg | Freq: Four times a day (QID) | INTRAMUSCULAR | Status: DC | PRN
Start: 2013-11-29 — End: 2013-11-29

## 2013-11-29 MED ORDER — CLINDAMYCIN PHOSPHATE 900 MG/50ML IV SOLN
INTRAVENOUS | Status: AC
  Filled 2013-11-29: qty 50

## 2013-11-29 MED ORDER — LIDOCAINE HCL 1 % IJ SOLN
INTRAMUSCULAR | Status: DC | PRN
  Administered 2013-11-29: 60 mg via INTRADERMAL

## 2013-11-29 MED ORDER — BUPIVACAINE LIPOSOME 1.3 % IJ SUSP
INTRAMUSCULAR | Status: AC
  Filled 2013-11-29: qty 20

## 2013-11-29 MED ORDER — BUPIVACAINE-EPINEPHRINE (PF) 0.5% -1:200000 IJ SOLN
INTRAMUSCULAR | Status: AC
  Filled 2013-11-29: qty 30

## 2013-11-29 MED ORDER — FENTANYL CITRATE 0.05 MG/ML IJ SOLN
INTRAMUSCULAR | Status: AC
  Filled 2013-11-29: qty 5

## 2013-11-29 MED ORDER — SODIUM CHLORIDE 0.9 % IR SOLN
Status: DC | PRN
  Administered 2013-11-29: 3000 mL
  Administered 2013-11-29 (×2): 1000 mL
  Administered 2013-11-29: 3000 mL

## 2013-11-29 MED ORDER — GENTAMICIN SULFATE 40 MG/ML IJ SOLN
340.0000 mg | INTRAVENOUS | Status: AC
  Filled 2013-11-29: qty 8.5

## 2013-11-29 MED ORDER — GENTAMICIN SULFATE 40 MG/ML IJ SOLN
103.3500 mg | INTRAVENOUS | Status: DC | PRN
  Administered 2013-11-29: 340 mg via INTRAVENOUS

## 2013-11-29 MED ORDER — ROCURONIUM BROMIDE 50 MG/5ML IV SOLN
INTRAVENOUS | Status: AC
Start: 2013-11-29 — End: 2013-11-29
  Filled 2013-11-29: qty 1

## 2013-11-29 MED ORDER — MIDAZOLAM HCL 2 MG/2ML IJ SOLN
1.0000 mg | INTRAMUSCULAR | Status: DC | PRN
  Administered 2013-11-29: 2 mg via INTRAVENOUS

## 2013-11-29 MED ORDER — PANTOPRAZOLE SODIUM 40 MG PO TBEC: 40.0000 mg | DELAYED_RELEASE_TABLET | Freq: Every day | ORAL | Status: DC

## 2013-11-29 MED ORDER — ONDANSETRON HCL 4 MG/2ML IJ SOLN
4.0000 mg | Freq: Once | INTRAMUSCULAR | Status: AC
  Administered 2013-11-29: 4 mg via INTRAVENOUS

## 2013-11-29 MED ORDER — GENTAMICIN SULFATE 40 MG/ML IJ SOLN
INTRAVENOUS | Status: DC
  Filled 2013-11-29: qty 8.5

## 2013-11-29 MED ORDER — GLYCOPYRROLATE 0.2 MG/ML IJ SOLN
INTRAMUSCULAR | Status: AC
  Filled 2013-11-29: qty 2

## 2013-11-29 MED ORDER — PROPOFOL 10 MG/ML IV EMUL
INTRAVENOUS | Status: AC
  Filled 2013-11-29: qty 20

## 2013-11-29 MED ORDER — FENTANYL CITRATE 0.05 MG/ML IJ SOLN
INTRAMUSCULAR | Status: AC
  Filled 2013-11-29: qty 2

## 2013-11-29 MED ORDER — SUCCINYLCHOLINE CHLORIDE 20 MG/ML IJ SOLN
INTRAMUSCULAR | Status: DC | PRN
  Administered 2013-11-29: 120 mg via INTRAVENOUS

## 2013-11-29 MED ORDER — LIDOCAINE HCL (PF) 1 % IJ SOLN
INTRAMUSCULAR | Status: AC
  Filled 2013-11-29: qty 5

## 2013-11-29 MED ORDER — ONDANSETRON HCL 4 MG/2ML IJ SOLN: 4.0000 mg | Freq: Four times a day (QID) | INTRAMUSCULAR | Status: DC | PRN

## 2013-11-29 MED ORDER — FENTANYL CITRATE 0.05 MG/ML IJ SOLN
INTRAMUSCULAR | Status: DC | PRN
  Administered 2013-11-29 (×6): 50 ug via INTRAVENOUS
  Administered 2013-11-29: 100 ug via INTRAVENOUS
  Administered 2013-11-29 (×2): 50 ug via INTRAVENOUS

## 2013-11-29 MED ORDER — DOCUSATE SODIUM 100 MG PO CAPS: 100.0000 mg | ORAL_CAPSULE | Freq: Two times a day (BID) | ORAL | Status: DC

## 2013-11-29 MED ORDER — NEOSTIGMINE METHYLSULFATE 10 MG/10ML IV SOLN
INTRAVENOUS | Status: DC | PRN
  Administered 2013-11-29 (×3): 1 mg via INTRAVENOUS

## 2013-11-29 MED ORDER — BUPIVACAINE LIPOSOME 1.3 % IJ SUSP
INTRAMUSCULAR | Status: DC | PRN
Start: 2013-11-29 — End: 2013-11-29
  Administered 2013-11-29: 13 mL

## 2013-11-29 MED ORDER — GLYCOPYRROLATE 0.2 MG/ML IJ SOLN
INTRAMUSCULAR | Status: DC | PRN
  Administered 2013-11-29: 0.4 mg via INTRAVENOUS
  Administered 2013-11-29: 0.2 mg via INTRAVENOUS

## 2013-11-29 MED ORDER — ONDANSETRON HCL 4 MG/2ML IJ SOLN: 4.0000 mg | Freq: Once | INTRAMUSCULAR | Status: DC | PRN

## 2013-11-29 SURGICAL SUPPLY — 77 items
APPLIER CLIP UNV 5X34 EPIX (ENDOMECHANICALS) ×4 IMPLANT
BAG HAMPER (MISCELLANEOUS) ×4 IMPLANT
BANDAGE STRIP 1X3 FLEXIBLE (GAUZE/BANDAGES/DRESSINGS) IMPLANT
BANDAID FLEXIBLE 1X3 (GAUZE/BANDAGES/DRESSINGS) ×16 IMPLANT
BLADE SURG SZ11 CARB STEEL (BLADE) ×4 IMPLANT
CLOSURE STERI-STRIP 1/4X4 (GAUZE/BANDAGES/DRESSINGS) ×4 IMPLANT
CLOSURE WOUND 1/4 X3 (GAUZE/BANDAGES/DRESSINGS) ×1
CLOTH BEACON ORANGE TIMEOUT ST (SAFETY) ×4 IMPLANT
COVER LIGHT HANDLE STERIS (MISCELLANEOUS) ×16 IMPLANT
DECANTER SPIKE VIAL GLASS SM (MISCELLANEOUS) ×4 IMPLANT
DRAPE PROXIMA HALF (DRAPES) ×12 IMPLANT
DRAPE STERI URO 9X17 APER PCH (DRAPES) ×4 IMPLANT
DURAPREP 26ML APPLICATOR (WOUND CARE) ×4 IMPLANT
ELECT REM PT RETURN 9FT ADLT (ELECTROSURGICAL) ×4
ELECTRODE REM PT RTRN 9FT ADLT (ELECTROSURGICAL) ×2 IMPLANT
FILTER SMOKE EVAC LAPAROSHD (FILTER) ×4 IMPLANT
FORMALIN 10 PREFIL 480ML (MISCELLANEOUS) ×8 IMPLANT
GAUZE PACKING 2X5 YD STRL (GAUZE/BANDAGES/DRESSINGS) ×4 IMPLANT
GAUZE SPONGE 4X4 16PLY XRAY LF (GAUZE/BANDAGES/DRESSINGS) ×4 IMPLANT
GLOVE BIO SURGEON STRL SZ 6.5 (GLOVE) ×6 IMPLANT
GLOVE BIO SURGEONS STRL SZ 6.5 (GLOVE) ×2
GLOVE BIOGEL PI IND STRL 7.0 (GLOVE) ×4 IMPLANT
GLOVE BIOGEL PI IND STRL 7.5 (GLOVE) ×2 IMPLANT
GLOVE BIOGEL PI IND STRL 9 (GLOVE) ×4 IMPLANT
GLOVE BIOGEL PI INDICATOR 7.0 (GLOVE) ×4
GLOVE BIOGEL PI INDICATOR 7.5 (GLOVE) ×2
GLOVE BIOGEL PI INDICATOR 9 (GLOVE) ×4
GLOVE ECLIPSE 9.0 STRL (GLOVE) ×8 IMPLANT
GOWN SPEC L3 XXLG W/TWL (GOWN DISPOSABLE) ×8 IMPLANT
GOWN STRL REUS W/TWL LRG LVL3 (GOWN DISPOSABLE) ×12 IMPLANT
INST SET LAPROSCOPIC GYN AP (KITS) ×4 IMPLANT
IV NS IRRIG 3000ML ARTHROMATIC (IV SOLUTION) ×8 IMPLANT
KIT BLADEGUARD II DBL (SET/KITS/TRAYS/PACK) ×4 IMPLANT
KIT ROOM TURNOVER AP CYSTO (KITS) ×4 IMPLANT
KIT ROOM TURNOVER APOR (KITS) ×4 IMPLANT
LIGASURE 5MM LAPAROSCOPIC (INSTRUMENTS) IMPLANT
LIGASURE LAP ATLAS 10MM 37CM (INSTRUMENTS) IMPLANT
LUBRICANT JELLY 4.5OZ STERILE (MISCELLANEOUS) ×4 IMPLANT
MANIFOLD NEPTUNE II (INSTRUMENTS) ×4 IMPLANT
NEEDLE HYPO 18GX1.5 BLUNT FILL (NEEDLE) ×4 IMPLANT
NEEDLE HYPO 25X1 1.5 SAFETY (NEEDLE) ×4 IMPLANT
NEEDLE INSUFFLATION 120MM (ENDOMECHANICALS) ×4 IMPLANT
NEEDLE INSUFFLATION 14GA 120MM (NEEDLE) ×4 IMPLANT
NS IRRIG 1000ML POUR BTL (IV SOLUTION) ×8 IMPLANT
PACK BASIC III (CUSTOM PROCEDURE TRAY) ×2
PACK PERI GYN (CUSTOM PROCEDURE TRAY) ×4 IMPLANT
PACK SRG BSC III STRL LF ECLPS (CUSTOM PROCEDURE TRAY) ×2 IMPLANT
PAD ARMBOARD 7.5X6 YLW CONV (MISCELLANEOUS) ×4 IMPLANT
SCALPEL HARMONIC ACE (MISCELLANEOUS) ×4 IMPLANT
SET BASIN LINEN APH (SET/KITS/TRAYS/PACK) ×4 IMPLANT
SET TUBE IRRIG SUCTION NO TIP (IRRIGATION / IRRIGATOR) ×4 IMPLANT
SOL PREP PROV IODINE SCRUB 4OZ (MISCELLANEOUS) IMPLANT
SOLUTION ANTI FOG 6CC (MISCELLANEOUS) ×4 IMPLANT
STRIP CLOSURE SKIN 1/4X3 (GAUZE/BANDAGES/DRESSINGS) ×3 IMPLANT
SUT CHROMIC 0 CT 1 (SUTURE) ×24 IMPLANT
SUT CHROMIC 2 0 CT 1 (SUTURE) ×8 IMPLANT
SUT PROLENE 0 CT 1 30 (SUTURE) IMPLANT
SUT PROLENE 2 0 FS (SUTURE) ×8 IMPLANT
SUT VIC AB 0 CT2 8-18 (SUTURE) ×4 IMPLANT
SUT VIC AB 2-0 CT1 27 (SUTURE)
SUT VIC AB 2-0 CT1 TAPERPNT 27 (SUTURE) IMPLANT
SUT VIC AB 4-0 PS2 27 (SUTURE) ×8 IMPLANT
SUT VICRYL 0 UR6 27IN ABS (SUTURE) ×4 IMPLANT
SYR 20CC LL (SYRINGE) ×4 IMPLANT
SYR BULB IRRIGATION 50ML (SYRINGE) ×4 IMPLANT
SYR CONTROL 10ML LL (SYRINGE) ×4 IMPLANT
SYRINGE 12CC LL (MISCELLANEOUS) ×4 IMPLANT
SYRINGE CONTROL L 12CC (SYRINGE) ×4 IMPLANT
TOWEL OR 17X26 4PK STRL BLUE (TOWEL DISPOSABLE) ×4 IMPLANT
TRAY FOLEY CATH 16FR SILVER (SET/KITS/TRAYS/PACK) ×4 IMPLANT
TROCAR ENDO BLADELESS 11MM (ENDOMECHANICALS) ×4 IMPLANT
TROCAR XCEL NON-BLD 5MMX100MML (ENDOMECHANICALS) ×4 IMPLANT
TROCAR XCEL UNIV SLVE 11M 100M (ENDOMECHANICALS) ×4 IMPLANT
TUBING INSUFFLATION (TUBING) ×4 IMPLANT
VERSALIGHT (MISCELLANEOUS) ×4 IMPLANT
WARMER LAPAROSCOPE (MISCELLANEOUS) ×4 IMPLANT
WATER STERILE IRR 1000ML POUR (IV SOLUTION) ×4 IMPLANT
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
## 2013-11-29 NOTE — Progress Notes (Signed)
Packing removed per order.  Pt tolerated well.  Pt able to urinate much better after removal.

## 2013-11-29 NOTE — Anesthesia Procedure Notes (Signed)
Procedure Name: Intubation Date/Time: 11/29/2013 7:45 AM Performed by: Charmaine Downs Pre-anesthesia Checklist: Patient being monitored, Suction available, Emergency Drugs available and Patient identified Patient Re-evaluated:Patient Re-evaluated prior to inductionOxygen Delivery Method: Circle system utilized Preoxygenation: Pre-oxygenation with 100% oxygen Intubation Type: IV induction Ventilation: Mask ventilation without difficulty and Oral airway inserted - appropriate to patient size Laryngoscope Size: Mac and 3 Grade View: Grade I Tube type: Oral Tube size: 7.0 mm Number of attempts: 1 Airway Equipment and Method: Stylet Placement Confirmation: ETT inserted through vocal cords under direct vision,  positive ETCO2 and breath sounds checked- equal and bilateral Secured at: 22 cm Tube secured with: Tape Dental Injury: Teeth and Oropharynx as per pre-operative assessment

## 2013-11-29 NOTE — Discharge Summary (Signed)
Physician Discharge Summary  Patient ID: [name] MRN: [id number] DOB/AGE: [birth year] 38 y.o.  Admit date: 11/29/2013 Discharge date: 11/29/2013  Admission Diagnoses:  Discharge Diagnoses:  Active Problems:   S/P laparoscopic assisted vaginal hysterectomy (LAVH)   Discharged Condition: good  Hospital Course: The patient underwent laparoscopically assisted vaginal hysterectomy with anterior and posterior repair the morning of 11/29/2013 with 150 cc blood loss vaginal packing and Foley catheter were in place. The patient had minimal pain and requested discharge the same day of surgery. Packing was removed and was catheter patient tolerated by mouth intake, did not require any IV analgesics. She was discharged at 6 PM in good condition for follow-up in one week [facility] , then 6 weeks  Consults: None  Significant Diagnostic Studies: labs:  CBC    Component Value Date/Time   WBC 7.6 11/23/2013 0930   RBC 4.50 11/23/2013 0930   HGB 12.8 11/23/2013 0930   HCT 39.6 11/23/2013 0930   PLT 261 11/23/2013 0930   MCV 88.0 11/23/2013 0930   MCH 28.4 11/23/2013 0930   MCHC 32.3 11/23/2013 0930   RDW 13.9 11/23/2013 0930      Treatments: surgery: Laparoscopic-assisted vaginal hysterectomy anterior and posterior repair  Discharge Exam: Blood pressure 103/53, pulse 49, temperature 97.3 F (36.3 C), temperature source Oral, resp. rate 12, height 5\' 3"  (1.6 m), weight 152 lb (68.947 kg), last menstrual period 11/03/2013, SpO2 98.00%. General appearance: alert and no distress GI: soft, non-tender; bowel sounds normal; no masses,  no organomegaly and Incision clean Extremities: extremities normal, atraumatic, no cyanosis or edema and Homans sign is negative, no sign of DVT  Disposition: 01-Home or Self Care  Discharge Instructions   Call MD for:  persistant nausea and vomiting    Complete by:  As directed      Call MD for:  temperature >100.4    Complete by:  As directed       Diet - low sodium heart healthy    Complete by:  As directed      Discharge instructions    Complete by:  As directed   Avoid heavy lifting, monitor temperature.     Increase activity slowly    Complete by:  As directed             Medication List         clobetasol ointment 0.05 %  Commonly known as:  TEMOVATE  Apply 1 application topically 2 (two) times daily as needed. For eczema     diazepam 2 MG tablet  Commonly known as:  VALIUM  Take 2 mg by mouth every 6 (six) hours as needed for anxiety.     estradiol 0.1 MG/24HR patch  Commonly known as:  VIVELLE-DOT  Place 1 patch (0.1 mg total) onto the skin 2 (two) times a week.     ibuprofen 600 MG tablet  Commonly known as:  ADVIL,MOTRIN  Take 1 tablet (600 mg total) by mouth every 6 (six) hours as needed (mild pain).     oxyCODONE-acetaminophen 5-325 MG per tablet  Commonly known as:  PERCOCET/ROXICET  Take 1-2 tablets by mouth every 4 (four) hours as needed for severe pain (moderate to severe pain (when tolerating fluids)).         SignedJonnie Kind 11/29/2013, 6:45 PM

## 2013-11-29 NOTE — Transfer of Care (Signed)
Immediate Anesthesia Transfer of Care Note  Patient: [name]  Procedure(s) Performed: Procedure(s): LAPAROSCOPIC ASSISTED VAGINAL HYSTERECTOMY (N/A) LAPAROSCOPIC BILATERAL SALPINGO OOPHORECTOMY (Bilateral) ANTERIOR (CYSTOCELE) AND POSTERIOR REPAIR (RECTOCELE) (N/A)  Patient Location: PACU  Anesthesia Type:General  Level of Consciousness: awake and patient cooperative  Airway & Oxygen Therapy: Patient Spontanous Breathing and Patient connected to face mask oxygen  Post-op Assessment: Report given to PACU RN, Post -op Vital signs reviewed and stable and Patient moving all extremities  Post vital signs: Reviewed and stable  Complications: No apparent anesthesia complications

## 2013-11-29 NOTE — Interval H&P Note (Signed)
History and Physical Interval Note:  11/29/2013 7:21 AM  [name]  has presented today for surgery, with the diagnosis of carcinoma in situ cervix,cystocele and rectocele   The various methods of treatment have been discussed with the patient and family. After consideration of risks, benefits and other options for treatment, the patient has consented to  Procedure(s): LAPAROSCOPIC ASSISTED VAGINAL HYSTERECTOMY (N/A) LAPAROSCOPIC BILATERAL SALPINGO OOPHORECTOMY (Bilateral) ANTERIOR (CYSTOCELE) AND POSTERIOR REPAIR (RECTOCELE) (N/A) as a surgical intervention .  The patient's history has been reviewed, patient examined, no change in status, stable for surgery.  I have reviewed the patient's chart and labs.  Chart review includes the 2013 tubal sterilization done with [facility] trochar, with op note detailing that the abdomen was "amazingly clear of adhesions" after prior laparotomy.   Questions were answered to the patient's satisfaction.     Jonnie Kind

## 2013-11-29 NOTE — Anesthesia Postprocedure Evaluation (Signed)
  Anesthesia Post-op Note  Patient: [name]  Procedure(s) Performed: Procedure(s): LAPAROSCOPIC ASSISTED VAGINAL HYSTERECTOMY (N/A) LAPAROSCOPIC BILATERAL SALPINGO OOPHORECTOMY (Bilateral) ANTERIOR (CYSTOCELE) AND POSTERIOR REPAIR (RECTOCELE) (N/A)  Patient Location: PACU  Anesthesia Type:General  Level of Consciousness: awake, alert , oriented and patient cooperative  Airway and Oxygen Therapy: Patient Spontanous Breathing  Post-op Pain: 4 /10, mild  Post-op Assessment: vss,no nausea present  Post-op Vital Signs: Reviewed and stable  Last Vitals:  Filed Vitals:   11/29/13 0730  BP: 104/69  Pulse:   Temp:   Resp: 13    Complications: No apparent anesthesia complications

## 2013-11-29 NOTE — Progress Notes (Signed)
Day of Surgery Procedure(s) (LRB): LAPAROSCOPIC ASSISTED VAGINAL HYSTERECTOMY (N/A) LAPAROSCOPIC BILATERAL SALPINGO OOPHORECTOMY (Bilateral) ANTERIOR (CYSTOCELE) AND POSTERIOR REPAIR (RECTOCELE) (N/A)  Subjective: Patient reports tolerating PO and no problems voiding.  Wants to go home.   Objective: I have reviewed patient's vital signs, intake and output and medications.  General: alert, cooperative and no distress GI: soft, non-tender; bowel sounds normal; no masses,  no organomegaly and incision: clean, dry and intact  Assessment: s/p Procedure(s): LAPAROSCOPIC ASSISTED VAGINAL HYSTERECTOMY (N/A) LAPAROSCOPIC BILATERAL SALPINGO OOPHORECTOMY (Bilateral) ANTERIOR (CYSTOCELE) AND POSTERIOR REPAIR (RECTOCELE) (N/A): stable and tolerating diet  Plan: Discharge home will release now  LOS: 0 days    Kip Cropp V 11/29/2013, 6:34 PM

## 2013-11-29 NOTE — Progress Notes (Signed)
Pt feels she does not need PCA pump and requesting foley removed.  MD notified and orders received.

## 2013-11-29 NOTE — Op Note (Signed)
11/29/2013  11:01 AM  PATIENT:  [name]  38 y.o. 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  PROCEDURE:  Procedure(s): LAPAROSCOPIC ASSISTED VAGINAL HYSTERECTOMY (N/A) LAPAROSCOPIC BILATERAL SALPINGO OOPHORECTOMY (Bilateral) ANTERIOR (CYSTOCELE) AND POSTERIOR REPAIR (RECTOCELE) (N/A)  SURGEON:  Surgeon(s) and Role:    * Jonnie Kind, MD - Primary  PHYSICIAN ASSISTANT:   ASSISTANTS: [place] RNFA   ANESTHESIA:   local and general  EBL:  Total I/O In: 3000 [I.V.:3000] Out: 1100 [Urine:950; Blood:150]  BLOOD ADMINISTERED:none  DRAINS: Urinary Catheter (Foley)   LOCAL MEDICATIONS USED:  MARCAINE   , Amount: 17 ml and OTHER Exparel 15  SPECIMEN:  Source of Specimen:  Uterus, cervix, tubes, ovaries  DISPOSITION OF SPECIMEN:  PATHOLOGY  COUNTS:  YES  TOURNIQUET:  * No tourniquets in log *  DICTATION: .Dragon Dictation  PLAN OF CARE: Admit for overnight observation  PATIENT DISPOSITION:  PACU - hemodynamically stable.   Delay start of Pharmacological VTE agent (>24hrs) due to surgical blood loss or risk of bleeding: not applicable Details of procedure: Patient was taken operating and prepped and draped, an abdominal and vaginal procedure, with timeout conducted and antibiotics were administered intravenously. The Foley catheter was in place, the uterus able to be manipulated with Hulka uterine manipulator, and attention directed to the umbilicus where a semicircular centimeters skin incision was made with sharp dissection and the fascia which did be visualized at the level of the previous Prolene sutures that closed the midline incision. Decision was made to elevate the fascia and use Veress needle, placed on the knowledge that previous surgery laparoscopic in 2013 had  identified a clean anterior abdominal wall. Her tuning was easily achieved under 6 mmHg pressure after water droplet technique confirmed intraperitoneal location of the Veress needle tip attention was then directed to the umbilicus with the laparoscopic trocar inserted under direct visualization, and pelvis inspected with no evidence of trauma or bleeding. Recommended. The patient was placed in Trendelenburg position after 5 mm trochars were placed in the right lower quadrant and left lower quadrant under direct visualization. Attention the pelvis followed. The uterus could be elevated anteriorly and the right adnexa was addressed first. The ureter could be visualized from the pelvic brim well down into the pelvic floor and was well away from the adnexal structures. The right IP ligament was coagulated with Harmonic dissected and then the round ligament taken down broad ligament second free and mobilized down to the level of the bladder flap which was developed across anterior lower uterine segment. This able be sharply dissected downward about 2 cm without difficulty. Excellent attention was then directed left side where the tube and ovary could be placed under countertraction. With the surgeon moving to the left side, we were able to elevate the tube and ovary again identified the ureter well out of the area of surgery, and the left IP ligament was then coagulated with Harmonic scalpel and transected. This ligament was thicker and there was some oozing from the backside of it. Left scopic medium clips were placed on the pedicle 4 adequately control hemostasis. The rest of the broad ligament was transected in the midline sharply dissecting down to the level bladder flap anteriorly the uterine vessels were not transected laparoscopically but were left in the vaginal portion of the case. Pelvis was irrigated confirmed as hemostatic  and the specimen removed, and the sleeves in place but just barely into the abdominal  cavity and the abdomen deflated. The abdomen was covered over tension to the vaginal portion procedure initiated. Surgeon repositioned, and the vaginal portion of the case began. Cervix was circumscribed from 9:00 to 3:00 the bladder flap easily elevated up and with the bladder flap dissection from above. The bladder could then be retracted without difficulty. Posterior colpotomy incision was made in the cul-de-sac, and the uterosacral ligaments were addressed next. With a weighted speculum in place, Zeppelin clamp was used to cross-clamp the uterosacral ligament on the patient left, clamping cutting and suture ligating and tagging it, with 0 chromic. The lower cardinal ligaments were then clamped cut and suture ligated on the left side. The right side was then treated in similar fashion. The large weighted speculum was replaced into the cul-de-sac, and then we marched up the side of the uterus clamping cutting and suture ligating in order to transect the uterine vessels, and reached the level of the dissection from above and removed the uterus tubes and ovaries in one specimen. Pedicles were inspected, one superficial capillary  arterial bleeder on the right side was coagulated, otherwise procedure was uneventful. Uterosacral ligaments were then sutured with 2-0 Prolene, pulling approximately 1 cm cul-de-sac lateral on each side grasping from the medial aspect of the uterosacral ligament in order to reduce the potential for vault prolapse in the future. Peritoneum was pulled from anterior to posterior and reapproximated with running 2-0 chromic. Anterior repair. Anterior repair consisted of dissecting a 1.5 cm x 1.5 cm triangle tissue off of the anterior vaginal cuff edge after injection with Marcaine with epinephrine, sewing the underside of the epithelium in the midline with 2 interrupted sutures horizontal mattress 0 Vicryl in reattached and the cuff edge to the superior vaginal cuff, closing the cuff in the  process resulting in improved support anteriorly. Posterior repair. Posterior repair consisted of splitting the very lax perineal body along the scar tissue from the prior second-degree obstetric laceration, that it healed with a running gaping introitus. Marcaine with epinephrine had been injected into the perineal body with a slight transient tachycardia associated with its injection the vaginal epithelium was dissected off the underlying connective tissue, then Allis clamps used to grasp lateral supportive tissues of the perineal body followed in the midline with a series of 6 interrupted mattress sutures of 0 Vicryl. Perineal body was improved by this process in thickness and support. Vaginal we have still allowed the side without difficulty. The operator's right index finger was double gloved, digital rectal exam performed confirms were no stitches in the rectum the vaginal epithelium altogether with running 2-0 Vicryl. Reports change between procedure portions. Main clear and generous amounts, Foley catheter remained in place. Vaginal packing with Betadine soaked gauze was performed. Repeat inspection of the pelvis was then performed. Laparoscopic ports and the case was then resumed after re-insufflation, having changed gowns and gloves prior to restarting the abdominal portion of the case. Pelvis was inspected and was hemostatic except that we noticed that with the irrigation and inspection there was some continued ooze from the left infundibulopelvic ligament pedicle. This could be accessed better after suprapubic limb millimeter trocar was inserted just with coagulation and clipping. Back we placed on traction and again well away from the ureter, and then a Harmonic scalpel was used to very superficially coagulate the pedicle. Photos were taken to confirm its hemostasis. Procedure was performed with the pedicle  on traction so that the retroperitoneal structures were protected. Patient tolerated procedure  well and went to recovery room in good condition after we closed the abdomen with 0 Vicryl at the umbilical and suprapubic site and then used 4-0 Vicryl at all 4 trocar sites. Steri-Strips were applied, with a small pressure dressing placed  on the umbilicus. Patient to recovery in good condition

## 2013-11-29 NOTE — Discharge Instructions (Signed)
Abdominal Hysterectomy, Care After Refer to this sheet in the next few weeks. These instructions provide you with information on caring for yourself after your procedure. Your health care provider may also give you more specific instructions. Your treatment has been planned according to current medical practices, but problems sometimes occur. Call your health care provider if you have any problems or questions after your procedure.  WHAT TO EXPECT AFTER THE PROCEDURE After your procedure, it is typical to have the following:  Pain.  Feeling tired.  Poor appetite.  Less interest in sex. HOME CARE INSTRUCTIONS  It takes 4-6 weeks to recover from this surgery. Make sure you follow all your health care provider's instructions. Home care instructions may include:  Take pain medicines only as directed by your health care provider. Do not take over-the-counter pain medicines without checking with your health care provider first.  Change your bandage as directed by your health care provider.  Return to your health care provider to have your sutures taken out.  Take showers instead of baths for 2-3 weeks. Ask your health care provider when it is safe to start showering.  Do not douche, use tampons, or have sexual intercourse for at least 6 weeks or until your health care provider says you can.   Follow your health care provider's advice about exercise, lifting, driving, and general activities.  Get plenty of rest and sleep.   Do not lift anything heavier than a gallon of milk (about 10 lb [4.5 kg]) for the first month after surgery.  You can resume your normal diet if your health care provider says it is okay.   Do not drink alcohol until your health care provider says you can.   If you are constipated, ask your health care provider if you can take a mild laxative.  Eating foods high in fiber may also help with constipation. Eat plenty of raw fruits and vegetables, whole grains, and  beans.  Drink enough fluids to keep your urine clear or pale yellow.   Try to have someone at home with you for the first 1-2 weeks to help around the house.  Keep all follow-up appointments. SEEK MEDICAL CARE IF:   You have chills or fever.  You have swelling, redness, or pain in the area of your incision that is getting worse.   You have pus coming from the incision.   You notice a bad smell coming from the incision or bandage.   Your incision breaks open.   You feel dizzy or light-headed.   You have pain or bleeding when you urinate.   You have persistent diarrhea.   You have persistent nausea and vomiting.   You have abnormal vaginal discharge.   You have a rash.   You have any type of abnormal reaction or develop an allergy to your medicine.   Your pain medicine is not helping.  SEEK IMMEDIATE MEDICAL CARE IF:   You have a fever and your symptoms suddenly get worse.  You have severe abdominal pain.  You have chest pain.  You have shortness of breath.  You faint.  You have pain, swelling, or redness of your leg.  You have heavy vaginal bleeding with blood clots. MAKE SURE YOU:  Understand these instructions.  Will watch your condition.  Will get help right away if you are not doing well or get worse. Document Released: 08/09/2004 Document Revised: 01/25/2013 Document Reviewed: 11/12/2012 [employer] Patient Information 2015 [employer]. This information is not intended   to replace advice given to you by your health care provider. Make sure you discuss any questions you have with your health care provider.  

## 2013-11-29 NOTE — Brief Op Note (Signed)
11/29/2013  11:01 AM  PATIENT:  [name]  38 y.o. 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  PROCEDURE:  Procedure(s): LAPAROSCOPIC ASSISTED VAGINAL HYSTERECTOMY (N/A) LAPAROSCOPIC BILATERAL SALPINGO OOPHORECTOMY (Bilateral) ANTERIOR (CYSTOCELE) AND POSTERIOR REPAIR (RECTOCELE) (N/A)  SURGEON:  Surgeon(s) and Role:    * Jonnie Kind, MD - Primary  PHYSICIAN ASSISTANT:   ASSISTANTS: [place] RNFA   ANESTHESIA:   local and general  EBL:  Total I/O In: 3000 [I.V.:3000] Out: 1100 [Urine:950; Blood:150]  BLOOD ADMINISTERED:none  DRAINS: Urinary Catheter (Foley)   LOCAL MEDICATIONS USED:  MARCAINE   , Amount: 17 ml and OTHER Exparel 15  SPECIMEN:  Source of Specimen:  Uterus, cervix, tubes, ovaries  DISPOSITION OF SPECIMEN:  PATHOLOGY  COUNTS:  YES  TOURNIQUET:  * No tourniquets in log *  DICTATION: .Dragon Dictation  PLAN OF CARE: Admit for overnight observation  PATIENT DISPOSITION:  PACU - hemodynamically stable.   Delay start of Pharmacological VTE agent (>24hrs) due to surgical blood loss or risk of bleeding: not applicable

## 2013-11-30 ENCOUNTER — Encounter (HOSPITAL_COMMUNITY): Payer: Self-pay | Admitting: Obstetrics and Gynecology

## 2013-11-30 NOTE — Addendum Note (Signed)
Addendum created 11/30/13 1436 by Vista Deck, CRNA   Modules edited: Notes Section   Notes Section:  File: [id number]

## 2013-11-30 NOTE — Care Management Utilization Note (Signed)
UR completed 

## 2013-11-30 NOTE — Anesthesia Postprocedure Evaluation (Signed)
Patient discharged. No apparent anesthesia complications per chart review.

## 2013-12-01 ENCOUNTER — Telehealth: Payer: Self-pay | Admitting: Adult Health

## 2013-12-01 NOTE — Telephone Encounter (Signed)
Had LAVH 2 days ago had moderate bleeding last night ,not so much now and some pain not bad,Dr Glo Herring took over call and spoke with pt.

## 2013-12-04 LAB — TYPE AND SCREEN
ABO/RH(D): A POS
Antibody Screen: POSITIVE
DAT, IGG: NEGATIVE
DONOR AG TYPE: NEGATIVE
Donor AG Type: NEGATIVE
Unit division: 0
Unit division: 0

## 2013-12-05 ENCOUNTER — Encounter (HOSPITAL_COMMUNITY): Payer: Self-pay | Admitting: Obstetrics and Gynecology

## 2013-12-06 ENCOUNTER — Encounter: Payer: BC Managed Care – PPO | Admitting: Obstetrics and Gynecology

## 2013-12-07 ENCOUNTER — Encounter: Payer: Self-pay | Admitting: Obstetrics and Gynecology

## 2013-12-07 ENCOUNTER — Ambulatory Visit (INDEPENDENT_AMBULATORY_CARE_PROVIDER_SITE_OTHER): Payer: BC Managed Care – PPO | Admitting: Obstetrics and Gynecology

## 2013-12-07 VITALS — BP 100/70 | Wt 147.0 lb

## 2013-12-07 DIAGNOSIS — Z9889 Other specified postprocedural states: Secondary | ICD-10-CM

## 2013-12-07 MED ORDER — ESTRADIOL 1 MG PO TABS: 1.0000 mg | ORAL_TABLET | Freq: Every day | ORAL | Status: DC

## 2013-12-07 NOTE — Progress Notes (Signed)
Patient ID: [name], female   DOB: [birth year], 38 y.o.   MRN: [id number] Pt here today for 1 week post op/ s/p Lavhbso. Pt denies any problems or concerns at this time.  Exam : excellent healing of abd incisions Pt wants to be switched to oral estrogen. D/c'd vivelled dot Start estradiol 1mg  daily

## 2013-12-14 ENCOUNTER — Encounter: Payer: BC Managed Care – PPO | Admitting: Obstetrics and Gynecology

## 2014-01-09 ENCOUNTER — Encounter: Payer: Self-pay | Admitting: Obstetrics and Gynecology

## 2014-01-09 ENCOUNTER — Ambulatory Visit (INDEPENDENT_AMBULATORY_CARE_PROVIDER_SITE_OTHER): Payer: BC Managed Care – PPO | Admitting: Obstetrics and Gynecology

## 2014-01-09 VITALS — BP 120/80 | Ht 63.5 in | Wt 148.5 lb

## 2014-01-09 DIAGNOSIS — D069 Carcinoma in situ of cervix, unspecified: Secondary | ICD-10-CM

## 2014-01-09 DIAGNOSIS — Z90710 Acquired absence of both cervix and uterus: Secondary | ICD-10-CM

## 2014-01-09 DIAGNOSIS — Z9889 Other specified postprocedural states: Secondary | ICD-10-CM

## 2014-01-09 NOTE — Progress Notes (Signed)
Patient ID: [name], female   DOB: October 22, 1975, 38 y.o.   MRN: [id number] Subjective:     [name] is a 38 y.o. female who presents to the clinic 6 weeks status post laparoscopic assisted vaginal hysterectomy.  She denies dyspareunia as an associated symptom. Diet:       regular without difficulty. Bowel function is: normal. Pain:     The patient is not having any pain.  The following portions of the patient's history were reviewed and updated as appropriate: allergies, current medications, past family history, past medical history, past social history, past surgical history and problem list.  Review of Systems Pertinent items are noted in HPI.   Objective:    BP 120/80 mmHg  Ht 5' 3.5" (1.613 m)  Wt 148 lb 8 oz (67.359 kg)  BMI 25.89 kg/m2  LMP 11/03/2013 General:  alert, cooperative, appears stated age and no distress  Abdomen: soft, bowel sounds active, non-tender  Incision:   healing well, no drainage, no erythema, no hernia, no seroma, no swelling, no dehiscence, incision well approximated       Pelvic: vag cuff slight sensitivity and thickness in midline at 6 oclock on vaginal cuff, anterior and at 3 an 9 oclock, good flexibility is present, and nontender   Assessment:   Doing well postoperatively. Operative findings again reviewed. Pathology report discussed.   Plan:   1. Continue any current medications. 2. Wound care discussed. 3. Activity restrictions: none 4. Anticipated return to work: not applicable. 5. Follow up: 1 year.  This chart was scribed for Jonnie Kind, MD by Donato Schultz, ED Scribe. This patient was seen in Room 2 and the patient's care was started at 11:55 AM.

## 2014-01-09 NOTE — Progress Notes (Signed)
Patient ID: [name], female   DOB: Nov 01, 1975, 38 y.o.   MRN: [id number] Pt here today for post op visit. Pt denies any problems or concerns at this time.

## 2014-04-14 ENCOUNTER — Other Ambulatory Visit: Payer: Self-pay | Admitting: Obstetrics and Gynecology

## 2014-04-20 ENCOUNTER — Other Ambulatory Visit: Payer: Self-pay | Admitting: Obstetrics and Gynecology

## 2014-05-03 ENCOUNTER — Ambulatory Visit (INDEPENDENT_AMBULATORY_CARE_PROVIDER_SITE_OTHER): Payer: 59 | Admitting: Obstetrics and Gynecology

## 2014-05-03 ENCOUNTER — Encounter: Payer: Self-pay | Admitting: Obstetrics and Gynecology

## 2014-05-03 VITALS — BP 120/80 | Ht 63.0 in | Wt 149.0 lb

## 2014-05-03 DIAGNOSIS — D069 Carcinoma in situ of cervix, unspecified: Secondary | ICD-10-CM

## 2014-05-03 DIAGNOSIS — Z01419 Encounter for gynecological examination (general) (routine) without abnormal findings: Secondary | ICD-10-CM | POA: Diagnosis not present
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
## 2014-05-03 NOTE — Progress Notes (Signed)
Patient ID: [name], female   DOB: [birth year], 39 y.o.   MRN: [id number] Pt here today for annual exam. Pt denies any problems or concerns at this time.

## 2014-05-19 NOTE — Addendum Note (Signed)
Addended by: Jonnie Kind on: 05/19/2014 08:11 AM   Modules accepted: Level of Service

## 2014-10-05 ENCOUNTER — Ambulatory Visit (HOSPITAL_COMMUNITY)
Admission: RE | Admit: 2014-10-05 | Discharge: 2014-10-05 | Disposition: A | Discharge status: Home / Self Care | Payer: 59 | Source: Ambulatory Visit | Attending: Family Medicine | Admitting: Family Medicine

## 2014-10-05 ENCOUNTER — Other Ambulatory Visit (HOSPITAL_COMMUNITY): Payer: Self-pay | Admitting: Family Medicine

## 2014-10-05 DIAGNOSIS — S93402A Sprain of unspecified ligament of left ankle, initial encounter: Secondary | ICD-10-CM

## 2014-10-05 DIAGNOSIS — M25572 Pain in left ankle and joints of left foot: Secondary | ICD-10-CM | POA: Insufficient documentation

## 2014-12-13 ENCOUNTER — Other Ambulatory Visit: Payer: Self-pay | Admitting: Obstetrics and Gynecology

## 2015-05-07 ENCOUNTER — Other Ambulatory Visit: Payer: Self-pay | Admitting: Obstetrics and Gynecology

## 2015-05-23 ENCOUNTER — Other Ambulatory Visit: Payer: Self-pay | Admitting: Obstetrics and Gynecology

## 2015-05-23 ENCOUNTER — Ambulatory Visit (INDEPENDENT_AMBULATORY_CARE_PROVIDER_SITE_OTHER): Payer: Self-pay | Admitting: Obstetrics and Gynecology

## 2015-05-23 ENCOUNTER — Encounter: Payer: Self-pay | Admitting: Obstetrics and Gynecology

## 2015-05-23 VITALS — BP 130/76 | Ht 63.5 in | Wt 160.0 lb

## 2015-05-23 DIAGNOSIS — Z01419 Encounter for gynecological examination (general) (routine) without abnormal findings: Secondary | ICD-10-CM
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
## 2015-05-23 NOTE — Progress Notes (Signed)
Patient ID: [name], female   DOB: 1975-07-16, 40 y.o.   MRN: [id number] Pt here today for annual exam. Pt denies any problems or concerns at this time.

## 2015-05-24 DIAGNOSIS — Z01419 Encounter for gynecological examination (general) (routine) without abnormal findings: Secondary | ICD-10-CM | POA: Insufficient documentation

## 2015-11-27 ENCOUNTER — Encounter (HOSPITAL_COMMUNITY): Payer: Self-pay | Admitting: Emergency Medicine

## 2015-11-27 ENCOUNTER — Emergency Department (HOSPITAL_COMMUNITY): Payer: No Typology Code available for payment source

## 2015-11-27 ENCOUNTER — Emergency Department (HOSPITAL_COMMUNITY)
Admission: EM | Admit: 2015-11-27 | Discharge: 2015-11-27 | Disposition: A | Discharge status: Home / Self Care | Payer: No Typology Code available for payment source | Attending: Emergency Medicine | Admitting: Emergency Medicine

## 2015-11-27 DIAGNOSIS — S199XXA Unspecified injury of neck, initial encounter: Secondary | ICD-10-CM | POA: Diagnosis present

## 2015-11-27 DIAGNOSIS — Y92410 Unspecified street and highway as the place of occurrence of the external cause: Secondary | ICD-10-CM | POA: Insufficient documentation

## 2015-11-27 DIAGNOSIS — Z79899 Other long term (current) drug therapy: Secondary | ICD-10-CM | POA: Diagnosis not present

## 2015-11-27 DIAGNOSIS — S161XXA Strain of muscle, fascia and tendon at neck level, initial encounter: Secondary | ICD-10-CM | POA: Diagnosis not present

## 2015-11-27 DIAGNOSIS — Y939 Activity, unspecified: Secondary | ICD-10-CM | POA: Diagnosis not present

## 2015-11-27 DIAGNOSIS — F17210 Nicotine dependence, cigarettes, uncomplicated: Secondary | ICD-10-CM | POA: Insufficient documentation

## 2015-11-27 DIAGNOSIS — Z791 Long term (current) use of non-steroidal anti-inflammatories (NSAID): Secondary | ICD-10-CM | POA: Diagnosis not present

## 2015-11-27 DIAGNOSIS — Y999 Unspecified external cause status: Secondary | ICD-10-CM | POA: Insufficient documentation

## 2015-11-27 MED ORDER — METHOCARBAMOL 500 MG PO TABS: 500.0000 mg | ORAL_TABLET | Freq: Four times a day (QID) | ORAL | 0 refills | Status: DC

## 2015-11-27 MED ORDER — IBUPROFEN 800 MG PO TABS: 800.0000 mg | ORAL_TABLET | Freq: Three times a day (TID) | ORAL | 0 refills | Status: DC
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
## 2015-12-28 ENCOUNTER — Other Ambulatory Visit: Payer: Self-pay | Admitting: Obstetrics and Gynecology

## 2016-01-01 ENCOUNTER — Other Ambulatory Visit: Payer: Self-pay | Admitting: Obstetrics and Gynecology

## 2016-01-01 ENCOUNTER — Telehealth: Payer: Self-pay | Admitting: Obstetrics and Gynecology

## 2016-01-01 NOTE — Telephone Encounter (Signed)
Pt requesting refill on Estradiol 1 mg. 

## 2016-01-01 NOTE — Telephone Encounter (Signed)
Please send recall 

## 2016-01-03 NOTE — Telephone Encounter (Signed)
Left message on VM to let patient know prescription was sent to pharmacy

## 2016-02-01 ENCOUNTER — Other Ambulatory Visit: Payer: Self-pay | Admitting: Obstetrics and Gynecology

## 2016-07-07 ENCOUNTER — Other Ambulatory Visit: Payer: Self-pay | Admitting: Obstetrics and Gynecology

## 2016-07-08 NOTE — Telephone Encounter (Signed)
refil x 1 yr 

## 2017-05-20 ENCOUNTER — Other Ambulatory Visit (HOSPITAL_COMMUNITY): Payer: Self-pay | Admitting: Family Medicine

## 2017-05-20 DIAGNOSIS — Z1231 Encounter for screening mammogram for malignant neoplasm of breast: Secondary | ICD-10-CM

## 2017-06-24 ENCOUNTER — Other Ambulatory Visit: Payer: Self-pay | Admitting: Obstetrics and Gynecology

## 2017-08-27 ENCOUNTER — Other Ambulatory Visit: Payer: Self-pay | Admitting: Obstetrics and Gynecology
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
## 2017-10-22 ENCOUNTER — Other Ambulatory Visit: Payer: Self-pay

## 2017-10-22 ENCOUNTER — Emergency Department (HOSPITAL_COMMUNITY)
Admission: EM | Admit: 2017-10-22 | Discharge: 2017-10-22 | Disposition: A | Discharge status: Home / Self Care | Payer: Self-pay | Attending: Emergency Medicine | Admitting: Emergency Medicine

## 2017-10-22 ENCOUNTER — Encounter (HOSPITAL_COMMUNITY): Payer: Self-pay

## 2017-10-22 DIAGNOSIS — Z79899 Other long term (current) drug therapy: Secondary | ICD-10-CM | POA: Insufficient documentation

## 2017-10-22 DIAGNOSIS — G510 Bell's palsy: Secondary | ICD-10-CM | POA: Insufficient documentation

## 2017-10-22 DIAGNOSIS — Z87891 Personal history of nicotine dependence: Secondary | ICD-10-CM | POA: Insufficient documentation

## 2017-10-22 HISTORY — DX: Accidental discharge from unspecified firearms or gun, initial encounter: W34.00XA

## 2017-10-22 LAB — BASIC METABOLIC PANEL
Anion gap: 9 (ref 5–15)
BUN: 13 mg/dL (ref 6–20)
CALCIUM: 9.6 mg/dL (ref 8.9–10.3)
CO2: 24 mmol/L (ref 22–32)
Chloride: 105 mmol/L (ref 98–111)
Creatinine, Ser: 0.68 mg/dL (ref 0.44–1.00)
GFR calc Af Amer: >60 mL/min (ref >60)
GLUCOSE: 88 mg/dL (ref 70–99)
Potassium: 4.5 mmol/L (ref 3.5–5.1)
Sodium: 138 mmol/L (ref 135–145)

## 2017-10-22 LAB — CBC
HCT: 44.6 % (ref 36.0–46.0)
HEMOGLOBIN: 14.3 g/dL (ref 12.0–15.0)
MCH: 27.9 pg (ref 26.0–34.0)
MCHC: 32.1 g/dL (ref 30.0–36.0)
MCV: 87.1 fL (ref 78.0–100.0)
Platelets: 226 10*3/uL (ref 150–400)
RBC: 5.12 MIL/uL — ABNORMAL HIGH (ref 3.87–5.11)
RDW: 13.7 % (ref 11.5–15.5)
WBC: 7.5 10*3/uL (ref 4.0–10.5)

## 2017-10-22 MED ORDER — PREDNISONE 50 MG PO TABS
60.0000 mg | ORAL_TABLET | Freq: Once | ORAL | Status: AC
  Administered 2017-10-22: 60 mg via ORAL
  Filled 2017-10-22: qty 1

## 2017-10-22 MED ORDER — PREDNISONE 10 MG PO TABS: 20.0000 mg | ORAL_TABLET | Freq: Every day | ORAL | 0 refills | Status: AC

## 2017-10-22 MED ORDER — POLYVINYL ALCOHOL 1.4 % OP SOLN: 1.0000 [drp] | OPHTHALMIC | 1 refills | Status: DC

## 2017-10-22 NOTE — Discharge Instructions (Signed)
Use a liquid tears in your right eye to prevent it from drying out.  Use as directed.  Take the prednisone as directed.  Make an appointment to follow-up with your primary care doctor as well as neurology.  Return for any new or worse symptoms.

## 2017-10-22 NOTE — ED Notes (Signed)
EDP at bedside  
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
## 2017-10-22 NOTE — ED Triage Notes (Signed)
Pt reports yesterday around 1pm her husband said her face was "kind of drawn on r side."  Pt has r sided facial droop.  PT says she noticed a headache on r side around 1pm too.  When asked, pt says she may feel a little weak on her r side but says it is very subtle.  Pt says she has frequent headaches so she didn't feel concerned about it yesterday.  Pt says has a little pain on r side of face.

## 2017-10-22 NOTE — ED Notes (Signed)
EDP at bedside updating patient and family. 

## 2017-12-07 ENCOUNTER — Encounter

## 2017-12-07 ENCOUNTER — Ambulatory Visit: Payer: Self-pay | Admitting: Neurology

## 2018-07-30 ENCOUNTER — Other Ambulatory Visit: Payer: Self-pay | Admitting: Obstetrics and Gynecology

## 2018-08-28 ENCOUNTER — Other Ambulatory Visit: Payer: Self-pay | Admitting: Obstetrics and Gynecology

## 2018-09-14 ENCOUNTER — Telehealth: Payer: Self-pay | Admitting: Obstetrics and Gynecology

## 2018-09-14 NOTE — Telephone Encounter (Signed)

## 2018-09-15 ENCOUNTER — Other Ambulatory Visit: Payer: Self-pay

## 2018-09-15 ENCOUNTER — Other Ambulatory Visit (HOSPITAL_COMMUNITY)
Admission: RE | Admit: 2018-09-15 | Discharge: 2018-09-15 | Disposition: A | Discharge status: Home / Self Care | Payer: Self-pay | Source: Ambulatory Visit | Attending: Obstetrics and Gynecology | Admitting: Obstetrics and Gynecology

## 2018-09-15 ENCOUNTER — Ambulatory Visit (INDEPENDENT_AMBULATORY_CARE_PROVIDER_SITE_OTHER): Payer: Self-pay | Admitting: Obstetrics and Gynecology

## 2018-09-15 ENCOUNTER — Encounter: Payer: Self-pay | Admitting: Obstetrics and Gynecology

## 2018-09-15 VITALS — BP 122/77 | HR 55 | Ht 63.2 in | Wt 188.0 lb

## 2018-09-15 DIAGNOSIS — D069 Carcinoma in situ of cervix, unspecified: Secondary | ICD-10-CM

## 2018-09-15 DIAGNOSIS — Z01419 Encounter for gynecological examination (general) (routine) without abnormal findings: Secondary | ICD-10-CM | POA: Insufficient documentation
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
## 2018-09-15 NOTE — Addendum Note (Signed)
Addended by: Christiana Pellant A on: 09/15/2018 11:53 AM   Modules accepted: Orders

## 2018-09-17 ENCOUNTER — Telehealth: Payer: Self-pay | Admitting: Obstetrics and Gynecology

## 2018-09-17 LAB — CYTOLOGY - PAP
Diagnosis: NEGATIVE
HPV: NOT DETECTED

## 2018-09-17 NOTE — Telephone Encounter (Signed)
Pt informed of normal results to pap. Repeat 5 yr.

## 2019-01-05 ENCOUNTER — Other Ambulatory Visit: Payer: Self-pay | Admitting: Obstetrics and Gynecology

## 2019-01-05 ENCOUNTER — Telehealth: Payer: Self-pay | Admitting: *Deleted

## 2019-01-05 MED ORDER — ESTRADIOL 1 MG PO TABS: 1.0000 mg | ORAL_TABLET | Freq: Every day | ORAL | 3 refills | Status: DC

## 2019-01-05 NOTE — Telephone Encounter (Signed)
Pt advised refill was sent and asked to call pharmacy with further refill requests. Pt stated she had done that. [place]

## 2019-01-05 NOTE — Telephone Encounter (Signed)
Pt needs a refill on her Estradiol. She has been out for a few days and has a headache. Please refill. Thanks!!

## 2019-01-05 NOTE — Telephone Encounter (Signed)
I will refil this.  For refil requests, please contact the pharmacy, who can route refil requests the most efficiently within the systems in place. Thanks.

## 2019-04-05 ENCOUNTER — Other Ambulatory Visit (HOSPITAL_COMMUNITY): Payer: Self-pay | Admitting: Family Medicine

## 2019-04-05 ENCOUNTER — Ambulatory Visit (HOSPITAL_COMMUNITY)
Admission: RE | Admit: 2019-04-05 | Discharge: 2019-04-05 | Disposition: A | Discharge status: Home / Self Care | Payer: 59 | Source: Ambulatory Visit | Attending: Family Medicine | Admitting: Family Medicine

## 2019-04-05 ENCOUNTER — Other Ambulatory Visit: Payer: Self-pay

## 2019-04-05 DIAGNOSIS — M25552 Pain in left hip: Secondary | ICD-10-CM

## 2019-04-05 DIAGNOSIS — M79652 Pain in left thigh: Secondary | ICD-10-CM | POA: Diagnosis not present

## 2019-04-07 ENCOUNTER — Other Ambulatory Visit (HOSPITAL_COMMUNITY): Payer: Self-pay | Admitting: Family Medicine

## 2019-04-07 DIAGNOSIS — Z1231 Encounter for screening mammogram for malignant neoplasm of breast: Secondary | ICD-10-CM

## 2019-04-11 ENCOUNTER — Ambulatory Visit (HOSPITAL_COMMUNITY)
Admission: RE | Admit: 2019-04-11 | Discharge: 2019-04-11 | Disposition: A | Discharge status: Home / Self Care | Payer: 59 | Source: Ambulatory Visit | Attending: Family Medicine | Admitting: Family Medicine

## 2019-04-11 ENCOUNTER — Other Ambulatory Visit: Payer: Self-pay

## 2019-04-11 DIAGNOSIS — Z1231 Encounter for screening mammogram for malignant neoplasm of breast: Secondary | ICD-10-CM

## 2019-11-22 ENCOUNTER — Ambulatory Visit (INDEPENDENT_AMBULATORY_CARE_PROVIDER_SITE_OTHER): Payer: 59 | Admitting: Obstetrics and Gynecology

## 2019-11-22 ENCOUNTER — Encounter: Payer: Self-pay | Admitting: Obstetrics and Gynecology

## 2019-11-22 DIAGNOSIS — N898 Other specified noninflammatory disorders of vagina: Secondary | ICD-10-CM | POA: Insufficient documentation

## 2019-11-22 DIAGNOSIS — N939 Abnormal uterine and vaginal bleeding, unspecified: Secondary | ICD-10-CM
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
## 2020-01-16 ENCOUNTER — Other Ambulatory Visit: Payer: Self-pay | Admitting: Obstetrics & Gynecology

## 2020-02-09 ENCOUNTER — Ambulatory Visit (HOSPITAL_COMMUNITY)
Admission: RE | Admit: 2020-02-09 | Discharge: 2020-02-09 | Disposition: A | Discharge status: Home / Self Care | Payer: 59 | Source: Ambulatory Visit | Attending: Family Medicine | Admitting: Family Medicine

## 2020-02-09 ENCOUNTER — Other Ambulatory Visit (HOSPITAL_COMMUNITY): Payer: Self-pay | Admitting: Family Medicine

## 2020-02-09 ENCOUNTER — Other Ambulatory Visit: Payer: Self-pay

## 2020-02-09 DIAGNOSIS — M25552 Pain in left hip: Secondary | ICD-10-CM

## 2020-06-25 ENCOUNTER — Other Ambulatory Visit (HOSPITAL_COMMUNITY): Payer: Self-pay | Admitting: Adult Health

## 2020-06-25 DIAGNOSIS — Z1231 Encounter for screening mammogram for malignant neoplasm of breast: Secondary | ICD-10-CM

## 2020-07-04 ENCOUNTER — Ambulatory Visit (HOSPITAL_COMMUNITY): Payer: 59

## 2020-07-13 ENCOUNTER — Other Ambulatory Visit: Payer: Self-pay

## 2020-07-13 ENCOUNTER — Ambulatory Visit (HOSPITAL_COMMUNITY)
Admission: RE | Admit: 2020-07-13 | Discharge: 2020-07-13 | Disposition: A | Discharge status: Home / Self Care | Payer: 59 | Source: Ambulatory Visit | Attending: Adult Health | Admitting: Adult Health

## 2020-07-13 DIAGNOSIS — Z1231 Encounter for screening mammogram for malignant neoplasm of breast: Secondary | ICD-10-CM

## 2020-10-15 ENCOUNTER — Other Ambulatory Visit: Payer: Self-pay | Admitting: Obstetrics & Gynecology

## 2021-02-11 IMAGING — DX DG FEMUR 2+V*L*
4 series · 4 of 4 positions shown · non-contrast
Comparison: None.

CLINICAL DATA: Worsening left thigh pain for the past 2 months.

EXAM:
LEFT FEMUR 2 VIEWS

[femur ap (1 of 2)]
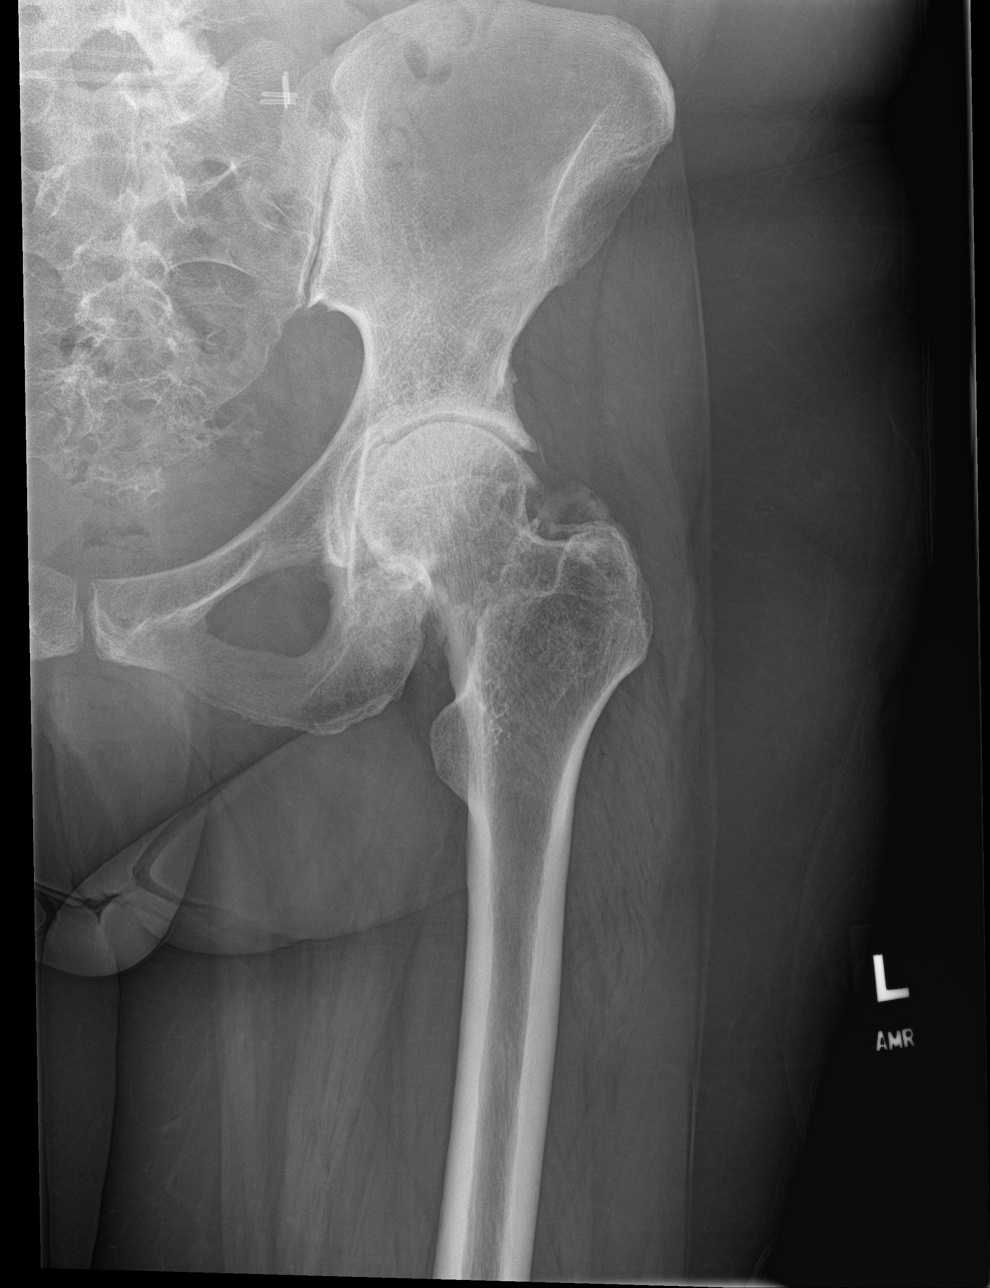

[femur ap (2 of 2)]
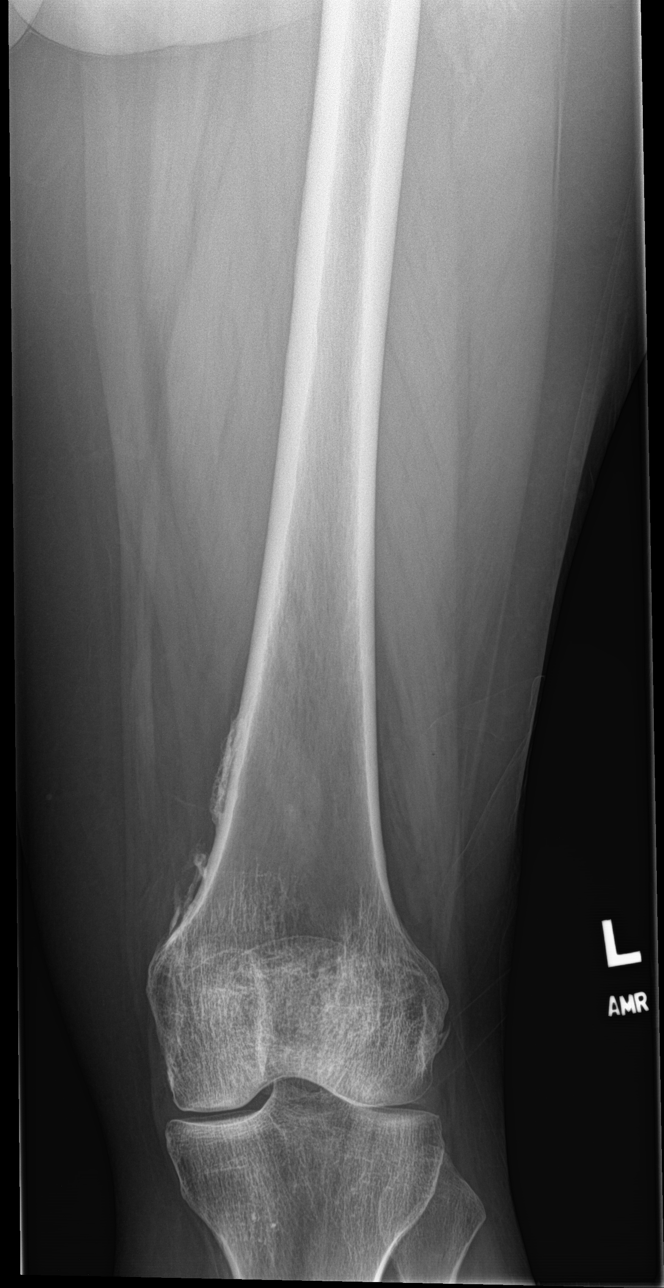

[femur lat (1 of 2)]
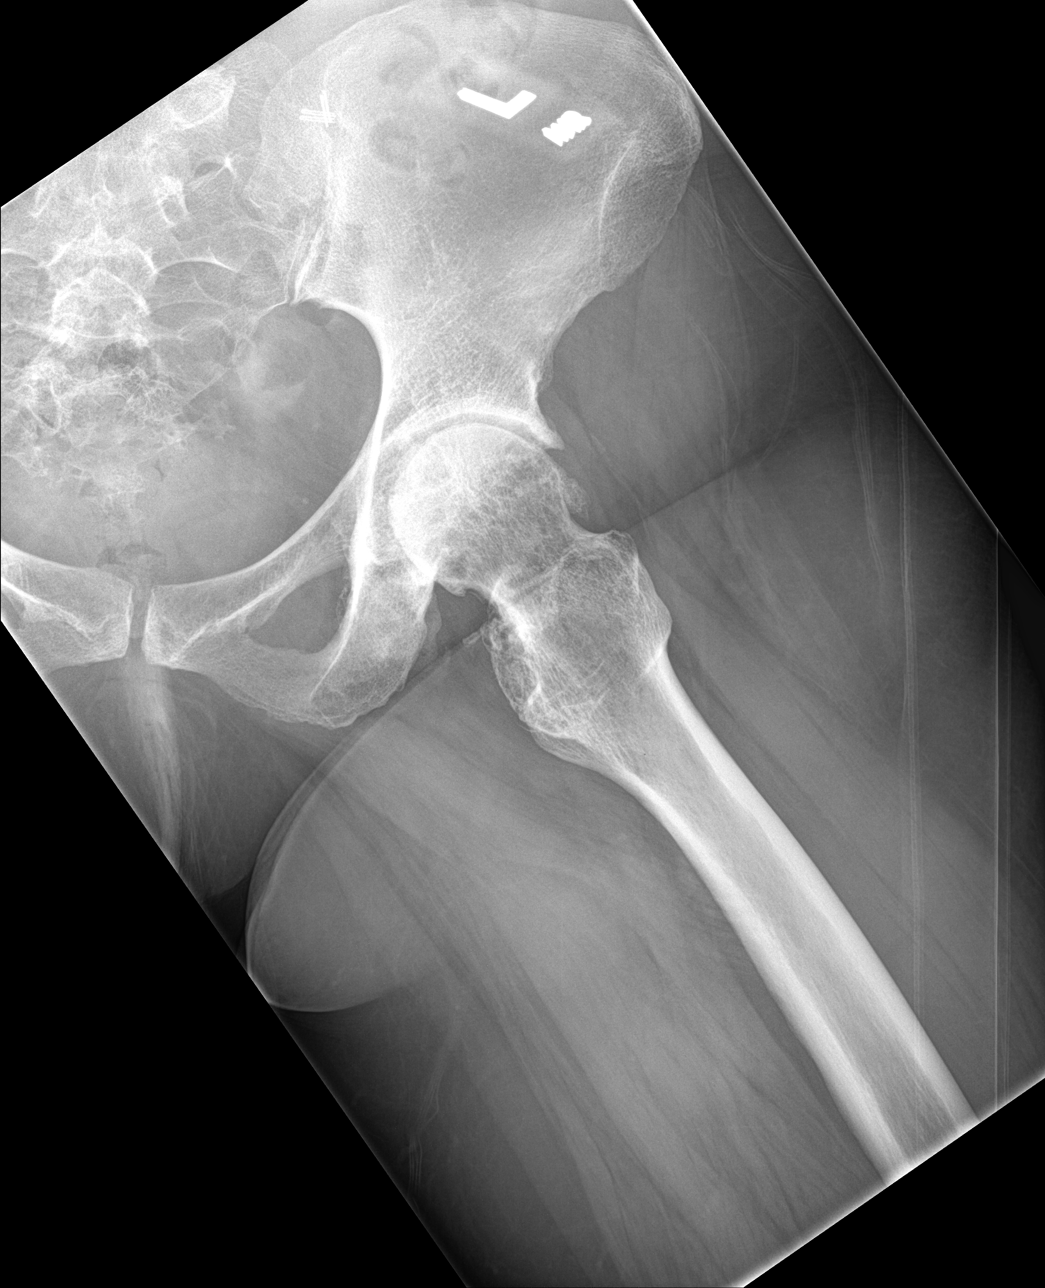

[femur lat (2 of 2)]
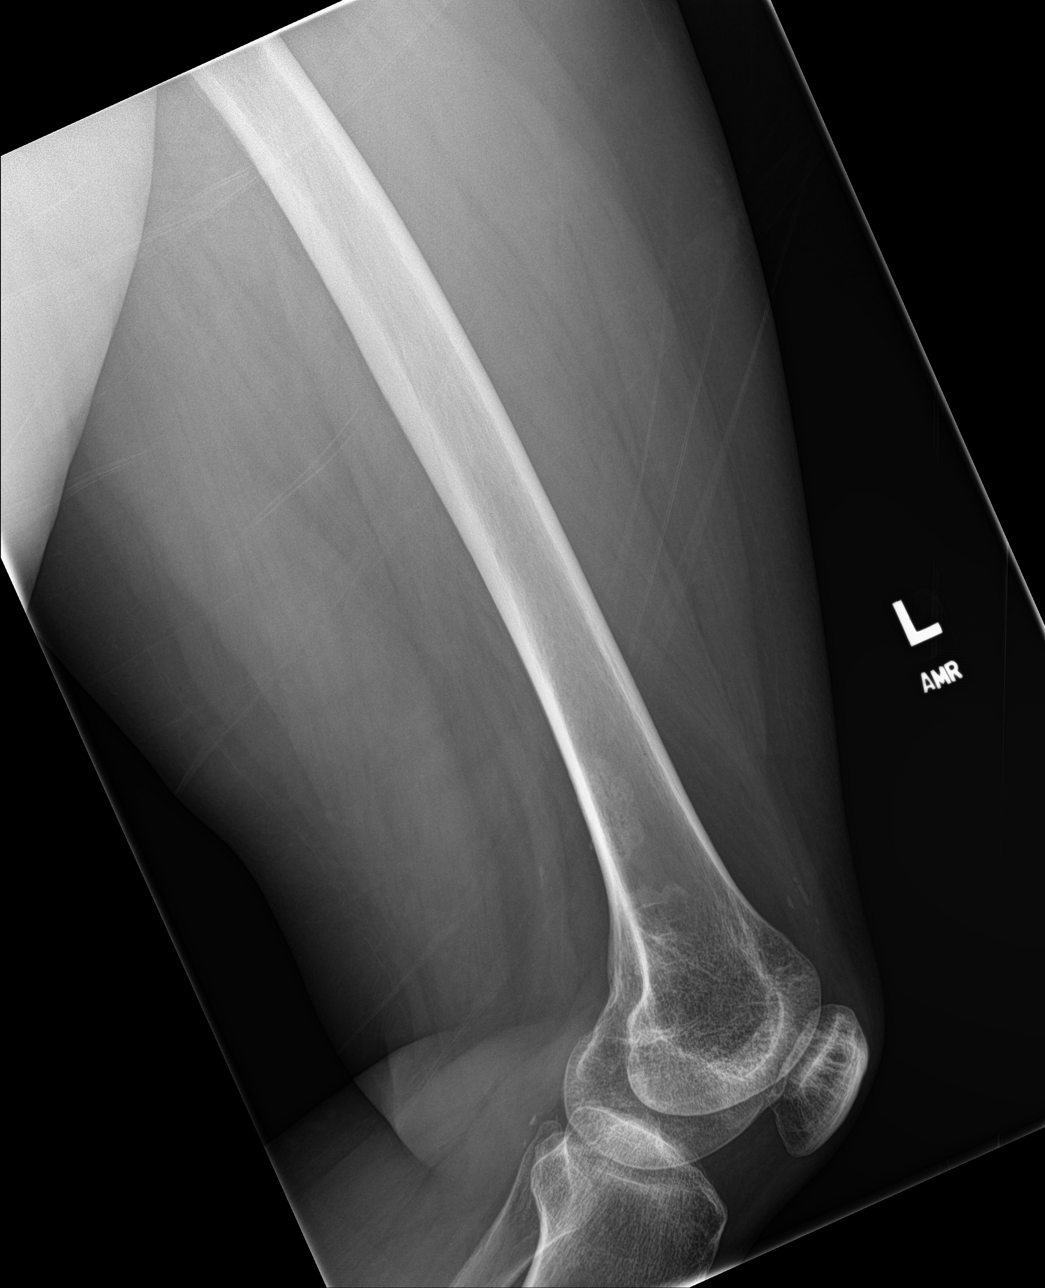

[4 of 4 positions shown; findings below may reference images not displayed]

FINDINGS: No acute fracture or dislocation. Mild to moderate left hip joint
space narrowing with marginal osteophytes. No focal bone lesion.
Chronic periosteal thickening along the medial aspect of the distal
femoral metadiaphysis. Periarticular osteopenia around the knee. The
knee joint spaces are preserved. No knee joint effusion. Soft
tissues are unremarkable.
IMPRESSION: 1.  No acute osseous abnormality.
2. Mild to moderate left hip osteoarthritis.

## 2021-02-15 DIAGNOSIS — M25552 Pain in left hip: Secondary | ICD-10-CM | POA: Diagnosis not present

## 2021-03-14 DIAGNOSIS — M25552 Pain in left hip: Secondary | ICD-10-CM | POA: Diagnosis not present

## 2021-03-18 DIAGNOSIS — Z01818 Encounter for other preprocedural examination: Secondary | ICD-10-CM | POA: Diagnosis not present

## 2021-03-26 DIAGNOSIS — M25552 Pain in left hip: Secondary | ICD-10-CM | POA: Diagnosis not present

## 2021-04-04 ENCOUNTER — Other Ambulatory Visit: Payer: Self-pay

## 2021-04-04 ENCOUNTER — Ambulatory Visit (HOSPITAL_COMMUNITY): Payer: 59 | Attending: Orthopedic Surgery | Admitting: Physical Therapy

## 2021-04-04 ENCOUNTER — Encounter (HOSPITAL_COMMUNITY): Payer: Self-pay | Admitting: Physical Therapy

## 2021-04-04 DIAGNOSIS — M25552 Pain in left hip: Secondary | ICD-10-CM | POA: Diagnosis not present
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
## 2021-04-04 NOTE — Patient Instructions (Signed)
Access Code: [email or web address] ?URL: [email or web address] ?Date: 04/04/2021 ?Prepared by: Josue Hector ? ?Exercises ?Supine Bridge - 2-3 x daily - 7 x weekly - 2 sets - 10 reps - 3-5 second hold ?Hooklying Isometric Hip Flexion - 2-3 x daily - 7 x weekly - 2 sets - 10 reps - 3-5 second hold ?Hooklying Isometric Hip Abduction with Belt - 2-3 x daily - 7 x weekly - 2 sets - 10 reps - 3-5 second hold ?Supine Hip Internal and External Rotation - 2-3 x daily - 7 x weekly - 1 sets - 10 reps - 2-3 second hold ? ?

## 2021-04-08 ENCOUNTER — Other Ambulatory Visit: Payer: Self-pay

## 2021-04-08 ENCOUNTER — Ambulatory Visit (HOSPITAL_COMMUNITY): Payer: 59

## 2021-04-08 ENCOUNTER — Ambulatory Visit: Payer: Self-pay | Admitting: Physician Assistant

## 2021-04-08 DIAGNOSIS — M25552 Pain in left hip: Secondary | ICD-10-CM

## 2021-04-08 DIAGNOSIS — G8929 Other chronic pain: Secondary | ICD-10-CM
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
## 2021-04-10 ENCOUNTER — Other Ambulatory Visit: Payer: Self-pay

## 2021-04-10 ENCOUNTER — Ambulatory Visit (HOSPITAL_COMMUNITY): Payer: 59

## 2021-04-10 DIAGNOSIS — M25552 Pain in left hip: Secondary | ICD-10-CM | POA: Diagnosis not present
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
## 2021-04-11 ENCOUNTER — Ambulatory Visit: Payer: Self-pay | Admitting: Physician Assistant

## 2021-04-11 ENCOUNTER — Encounter (HOSPITAL_COMMUNITY): Payer: Self-pay
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
## 2021-04-16 ENCOUNTER — Other Ambulatory Visit: Payer: Self-pay

## 2021-04-16 ENCOUNTER — Ambulatory Visit (HOSPITAL_COMMUNITY): Payer: 59

## 2021-04-16 DIAGNOSIS — M25552 Pain in left hip: Secondary | ICD-10-CM
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
## 2021-04-19 ENCOUNTER — Ambulatory Visit (HOSPITAL_COMMUNITY): Payer: 59

## 2021-04-19 ENCOUNTER — Other Ambulatory Visit: Payer: Self-pay

## 2021-04-19 DIAGNOSIS — M25552 Pain in left hip: Secondary | ICD-10-CM

## 2021-04-23 DIAGNOSIS — M25552 Pain in left hip: Secondary | ICD-10-CM | POA: Diagnosis not present

## 2021-04-24 ENCOUNTER — Ambulatory Visit: Admit: 2021-04-24 | Payer: Self-pay | Admitting: Orthopedic Surgery

## 2021-04-24 ENCOUNTER — Other Ambulatory Visit: Payer: Self-pay

## 2021-04-24 ENCOUNTER — Ambulatory Visit (HOSPITAL_COMMUNITY): Payer: 59

## 2021-04-24 DIAGNOSIS — M25552 Pain in left hip: Secondary | ICD-10-CM | POA: Diagnosis not present

## 2021-04-24 SURGERY — ARTHROPLASTY, HIP, TOTAL,POSTERIOR APPROACH
Anesthesia: Choice | Site: Hip | Laterality: Left
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
## 2021-04-26 ENCOUNTER — Encounter (HOSPITAL_COMMUNITY): Payer: Self-pay | Admitting: Physical Therapy

## 2021-04-26 ENCOUNTER — Ambulatory Visit (HOSPITAL_COMMUNITY): Payer: 59 | Admitting: Physical Therapy

## 2021-04-26 ENCOUNTER — Other Ambulatory Visit: Payer: Self-pay

## 2021-04-26 DIAGNOSIS — M25552 Pain in left hip: Secondary | ICD-10-CM | POA: Diagnosis not present
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
## 2021-04-29 ENCOUNTER — Other Ambulatory Visit: Payer: Self-pay

## 2021-04-29 ENCOUNTER — Encounter (HOSPITAL_COMMUNITY): Payer: Self-pay | Admitting: Physical Therapy

## 2021-04-29 ENCOUNTER — Ambulatory Visit (HOSPITAL_COMMUNITY): Payer: 59 | Admitting: Physical Therapy

## 2021-04-29 DIAGNOSIS — M25552 Pain in left hip: Secondary | ICD-10-CM
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
## 2021-04-30 ENCOUNTER — Telehealth (HOSPITAL_COMMUNITY): Payer: Self-pay | Admitting: Family Medicine

## 2021-05-01 ENCOUNTER — Encounter (HOSPITAL_COMMUNITY): Payer: 59 | Admitting: Physical Therapy

## 2021-05-06 DIAGNOSIS — M25552 Pain in left hip: Secondary | ICD-10-CM | POA: Diagnosis not present

## 2021-05-21 DIAGNOSIS — M1612 Unilateral primary osteoarthritis, left hip: Secondary | ICD-10-CM | POA: Diagnosis present

## 2021-05-21 DIAGNOSIS — M25552 Pain in left hip: Secondary | ICD-10-CM | POA: Diagnosis not present
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
## 2021-05-21 NOTE — Telephone Encounter (Signed)
Pt stated she is having surgery and wants to keep the May appointments.  ?

## 2021-05-28 NOTE — Progress Notes (Addendum)
COVID Vaccine Completed: no ?Date COVID Vaccine completed: ?Has received booster: ?COVID vaccine manufacturer: [employer]  ? ?Date of COVID positive in last 90 days: no ? ?PCP - Sharilyn Sites, MD ?Cardiologist - n/a ? ?Clearance by Sharilyn Sites 03/18/21 on chart ? ?Chest x-ray - n/a ?EKG - n/a ?Stress Test - long time ago per pt ?ECHO - long time ago per pt ?Cardiac Cath - n/a ?Pacemaker/ICD device last checked: n/a ?Spinal Cord Stimulator: n/a ? ?Bowel Prep - no ? ?Sleep Study - n/a ?CPAP -  ? ?Fasting Blood Sugar - n/a ?Checks Blood Sugar _____ times a day ? ?Blood Thinner Instructions: n/a ?Aspirin Instructions: ?Last Dose: ? ?Activity level: Can go up a flight of stairs and perform activities of daily living without stopping and without symptoms of chest pain or shortness of breath. ?   ? ?Anesthesia review:  ? ?Patient denies shortness of breath, fever, cough and chest pain at PAT appointment ? ? ?Patient verbalized understanding of instructions that were given to them at the PAT appointment. Patient was also instructed that they will need to review over the PAT instructions again at home before surgery.  ?

## 2021-05-28 NOTE — Patient Instructions (Addendum)
DUE TO COVID-19 ONLY TWO VISITORS  (aged 46 and older)  ARE ALLOWED TO COME WITH YOU AND STAY IN THE WAITING ROOM ONLY DURING PRE OP AND PROCEDURE.   ?**NO VISITORS ARE ALLOWED IN THE SHORT STAY AREA OR RECOVERY ROOM!!** ? ? Your procedure is scheduled on: 06/10/21 ? ? Report to [facility] Main Entrance ? ?  Report to admitting at 5:15 AM ? ? Call this number if you have problems the morning of surgery [phone] ? ? Do not eat food :After Midnight. ? ? After Midnight you may have the following liquids until 4:30 AM DAY OF SURGERY ? ?Water ?Black Coffee (sugar ok, NO MILK/CREAM OR CREAMERS)  ?Tea (sugar ok, NO MILK/CREAM OR CREAMERS) regular and decaf                             ?Plain Jell-O (NO RED)                                           ?Fruit ices (not with fruit pulp, NO RED)                                     ?Popsicles (NO RED)                                                                  ?Juice: apple, WHITE grape, WHITE cranberry ?Sports drinks like Gatorade (NO RED) ?Clear broth(vegetable,chicken,beef) ? ?              ?The day of surgery:  ?Drink ONE (1) Pre-Surgery Clear Ensure at 4:30 AM the morning of surgery. Drink in one sitting. Do not sip.  ?This drink was given to you during your hospital  ?pre-op appointment visit. ?Nothing else to drink after completing the  ?Pre-Surgery Clear Ensure. ?  ?       If you have questions, please contact your surgeon?s office. ? ? ?FOLLOW BOWEL PREP AND ANY ADDITIONAL PRE OP INSTRUCTIONS YOU RECEIVED FROM YOUR SURGEON'S OFFICE!!! ?  ?  ?Oral Hygiene is also important to reduce your risk of infection.                                    ?Remember - BRUSH YOUR TEETH THE MORNING OF SURGERY WITH YOUR REGULAR TOOTHPASTE ? ? Take these medicines the morning of surgery with A SIP OF WATER: None ?                  ?           You may not have any metal on your body including hair pins, jewelry, and body piercing ? ?           Do not wear make-up, lotions,  powders, perfumes, or deodorant ? ?Do not wear nail polish including gel and S&S, artificial/acrylic nails, or any other type of covering on natural nails including finger and toenails. If  you have artificial nails, gel coating, etc. that needs to be removed by a nail salon please have this removed prior to surgery or surgery may need to be canceled/ delayed if the surgeon/ anesthesia feels like they are unable to be safely monitored.  ? ?Do not shave  48 hours prior to surgery.  ? ? Do not bring valuables to the hospital. [facility] NOT ?            RESPONSIBLE   FOR VALUABLES. ?  ? Patients discharged on the day of surgery will not be allowed to drive home.  Someone NEEDS to stay with you for the first 24 hours after anesthesia. ? ?            Please read over the following fact sheets you were given: IF [place] [phone]- Apolonio Schneiders ? ?   [place] - Preparing for Surgery ?Before surgery, you can play an important role.  Because skin is not sterile, your skin needs to be as free of germs as possible.  You can reduce the number of germs on your skin by washing with CHG (chlorahexidine gluconate) soap before surgery.  CHG is an antiseptic cleaner which kills germs and bonds with the skin to continue killing germs even after washing. ?Please DO NOT use if you have an allergy to CHG or antibacterial soaps.  If your skin becomes reddened/irritated stop using the CHG and inform your nurse when you arrive at Short Stay. ?Do not shave (including legs and underarms) for at least 48 hours prior to the first CHG shower.  You may shave your face/neck. ? ?Please follow these instructions carefully: ? 1.  Shower with CHG Soap the night before surgery and the  morning of surgery. ? 2.  If you choose to wash your hair, wash your hair first as usual with your normal  shampoo. ? 3.  After you shampoo, rinse your hair and body thoroughly to remove the shampoo.                             ? 4.  Use CHG as you would any other liquid soap.  You can apply chg directly to the skin and wash.  Gently with a scrungie or clean washcloth. ? 5.  Apply the CHG Soap to your body ONLY FROM THE NECK DOWN.   Do   not use on face/ open      ?                     Wound or open sores. Avoid contact with eyes, ears mouth and   genitals (private parts).  ?                     Production manager,  Genitals (private parts) with your normal soap. ?            6.  Wash thoroughly, paying special attention to the area where your    surgery  will be performed. ? 7.  Thoroughly rinse your body with warm water from the neck down. ? 8.  DO NOT shower/wash with your normal soap after using and rinsing off the CHG Soap. ?               9.  Pat yourself dry with a clean towel. ?  10.  Wear clean pajamas. ?           11.  Place clean sheets on your bed the night of your first shower and do not  sleep with pets. ?Day of Surgery : ?Do not apply any lotions/deodorants the morning of surgery.  Please wear clean clothes to the hospital/surgery center. ? ?FAILURE TO FOLLOW THESE INSTRUCTIONS MAY RESULT IN THE CANCELLATION OF YOUR SURGERY ? ?PATIENT SIGNATURE_________________________________ ? ?NURSE SIGNATURE__________________________________ ? ?________________________________________________________________________  ? ?Incentive Spirometer ? ?An incentive spirometer is a tool that can help keep your lungs clear and active. This tool measures how well you are filling your lungs with each breath. Taking long deep breaths may help reverse or decrease the chance of developing breathing (pulmonary) problems (especially infection) following: ?A long period of time when you are unable to move or be active. ?BEFORE THE PROCEDURE  ?If the spirometer includes an indicator to show your best effort, your nurse or respiratory therapist will set it to a desired goal. ?If possible, sit up straight or lean slightly forward. Try not to  slouch. ?Hold the incentive spirometer in an upright position. ?INSTRUCTIONS FOR USE  ?Sit on the edge of your bed if possible, or sit up as far as you can in bed or on a chair. ?Hold the incentive spirometer in an upright position. ?Breathe out normally. ?Place the mouthpiece in your mouth and seal your lips tightly around it. ?Breathe in slowly and as deeply as possible, raising the piston or the ball toward the top of the column. ?Hold your breath for 3-5 seconds or for as long as possible. Allow the piston or ball to fall to the bottom of the column. ?Remove the mouthpiece from your mouth and breathe out normally. ?Rest for a few seconds and repeat Steps 1 through 7 at least 10 times every 1-2 hours when you are awake. Take your time and take a few normal breaths between deep breaths. ?The spirometer may include an indicator to show your best effort. Use the indicator as a goal to work toward during each repetition. ?After each set of 10 deep breaths, practice coughing to be sure your lungs are clear. If you have an incision (the cut made at the time of surgery), support your incision when coughing by placing a pillow or rolled up towels firmly against it. ?Once you are able to get out of bed, walk around indoors and cough well. You may stop using the incentive spirometer when instructed by your caregiver.  ?RISKS AND COMPLICATIONS ?Take your time so you do not get dizzy or light-headed. ?If you are in pain, you may need to take or ask for pain medication before doing incentive spirometry. It is harder to take a deep breath if you are having pain. ?AFTER USE ?Rest and breathe slowly and easily. ?It can be helpful to keep track of a log of your progress. Your caregiver can provide you with a simple table to help with this. ?If you are using the spirometer at home, follow these instructions: ?SEEK MEDICAL CARE IF:  ?You are having difficultly using the spirometer. ?You have trouble using the spirometer as often as  instructed. ?Your pain medication is not giving enough relief while using the spirometer. ?You develop fever of 100.5? F (38.1? C) or higher. ?SEEK IMMEDIATE MEDICAL CARE IF:  ?You cough up bloody sputum t

## 2021-05-29 ENCOUNTER — Encounter (HOSPITAL_COMMUNITY)
Admission: RE | Admit: 2021-05-29 | Discharge: 2021-05-29 | Disposition: A | Discharge status: Home / Self Care | Payer: 59 | Source: Ambulatory Visit | Attending: Orthopedic Surgery | Admitting: Orthopedic Surgery

## 2021-05-29 ENCOUNTER — Encounter (HOSPITAL_COMMUNITY): Payer: Self-pay

## 2021-05-29 VITALS — BP 143/97 | HR 53 | Temp 98.3°F | Ht 63.0 in | Wt 178.0 lb

## 2021-05-29 DIAGNOSIS — Z01812 Encounter for preprocedural laboratory examination: Secondary | ICD-10-CM | POA: Insufficient documentation

## 2021-05-29 DIAGNOSIS — M25552 Pain in left hip: Secondary | ICD-10-CM | POA: Insufficient documentation

## 2021-05-29 DIAGNOSIS — G8929 Other chronic pain: Secondary | ICD-10-CM | POA: Diagnosis not present

## 2021-05-29 DIAGNOSIS — Z01818 Encounter for other preprocedural examination: Secondary | ICD-10-CM

## 2021-05-29 HISTORY — DX: Headache, unspecified: R51.9

## 2021-05-29 HISTORY — DX: Cerebral infarction, unspecified: I63.9

## 2021-05-29 HISTORY — DX: Cardiac murmur, unspecified: R01.1

## 2021-05-29 LAB — COMPREHENSIVE METABOLIC PANEL
ALT: 18 U/L (ref 0–44)
AST: 20 U/L (ref 15–41)
Albumin: 4.2 g/dL (ref 3.5–5.0)
Alkaline Phosphatase: 68 U/L (ref 38–126)
Anion gap: 7 (ref 5–15)
BUN: 12 mg/dL (ref 6–20)
CO2: 26 mmol/L (ref 22–32)
Calcium: 9.8 mg/dL (ref 8.9–10.3)
Chloride: 105 mmol/L (ref 98–111)
Creatinine, Ser: 0.79 mg/dL (ref 0.44–1.00)
GFR, Estimated: >60 mL/min (ref >60)
Glucose, Bld: 82 mg/dL (ref 70–99)
Potassium: 4.2 mmol/L (ref 3.5–5.1)
Sodium: 138 mmol/L (ref 135–145)
Total Bilirubin: 0.8 mg/dL (ref 0.3–1.2)
Total Protein: 7.9 g/dL (ref 6.5–8.1)

## 2021-05-29 LAB — CBC WITH DIFFERENTIAL/PLATELET
Abs Immature Granulocytes: 0.02 10*3/uL (ref 0.00–0.07)
Basophils Absolute: 0 10*3/uL (ref 0.0–0.1)
Basophils Relative: 1 %
Eosinophils Absolute: 0.1 10*3/uL (ref 0.0–0.5)
Eosinophils Relative: 1 %
HCT: 44.7 % (ref 36.0–46.0)
Hemoglobin: 14.1 g/dL (ref 12.0–15.0)
Immature Granulocytes: 0 %
Lymphocytes Relative: 30 %
Lymphs Abs: 2.2 10*3/uL (ref 0.7–4.0)
MCH: 27.1 pg (ref 26.0–34.0)
MCHC: 31.5 g/dL (ref 30.0–36.0)
MCV: 86 fL (ref 80.0–100.0)
Monocytes Absolute: 0.4 10*3/uL (ref 0.1–1.0)
Monocytes Relative: 5 %
Neutro Abs: 4.7 10*3/uL (ref 1.7–7.7)
Neutrophils Relative %: 63 %
Platelets: 304 10*3/uL (ref 150–400)
RBC: 5.2 MIL/uL — ABNORMAL HIGH (ref 3.87–5.11)
RDW: 13.3 % (ref 11.5–15.5)
WBC: 7.5 10*3/uL (ref 4.0–10.5)
nRBC: 0 % (ref 0.0–0.2)

## 2021-05-29 LAB — SURGICAL PCR SCREEN
MRSA, PCR: NEGATIVE
Staphylococcus aureus: NEGATIVE

## 2021-06-10 ENCOUNTER — Ambulatory Visit (HOSPITAL_COMMUNITY): Payer: 59

## 2021-06-10 ENCOUNTER — Encounter (HOSPITAL_COMMUNITY)
Admission: RE | Disposition: A | Discharge status: Home / Self Care | Payer: Self-pay | Source: Ambulatory Visit | Attending: Orthopedic Surgery

## 2021-06-10 ENCOUNTER — Ambulatory Visit (HOSPITAL_COMMUNITY)
Admission: RE | Admit: 2021-06-10 | Discharge: 2021-06-10 | Disposition: A | Discharge status: Home / Self Care | Payer: 59 | Source: Ambulatory Visit | Attending: Orthopedic Surgery | Admitting: Orthopedic Surgery

## 2021-06-10 ENCOUNTER — Other Ambulatory Visit: Payer: Self-pay

## 2021-06-10 ENCOUNTER — Encounter (HOSPITAL_COMMUNITY): Payer: Self-pay | Admitting: Orthopedic Surgery

## 2021-06-10 ENCOUNTER — Ambulatory Visit (HOSPITAL_COMMUNITY): Payer: 59 | Admitting: Anesthesiology

## 2021-06-10 ENCOUNTER — Ambulatory Visit (HOSPITAL_BASED_OUTPATIENT_CLINIC_OR_DEPARTMENT_OTHER): Payer: 59 | Admitting: Anesthesiology

## 2021-06-10 DIAGNOSIS — Z01818 Encounter for other preprocedural examination: Secondary | ICD-10-CM

## 2021-06-10 DIAGNOSIS — M1652 Unilateral post-traumatic osteoarthritis, left hip: Secondary | ICD-10-CM | POA: Diagnosis not present

## 2021-06-10 DIAGNOSIS — M1612 Unilateral primary osteoarthritis, left hip: Secondary | ICD-10-CM | POA: Diagnosis present

## 2021-06-10 DIAGNOSIS — Z8673 Personal history of transient ischemic attack (TIA), and cerebral infarction without residual deficits: Secondary | ICD-10-CM

## 2021-06-10 DIAGNOSIS — Z87891 Personal history of nicotine dependence: Secondary | ICD-10-CM | POA: Insufficient documentation

## 2021-06-10 DIAGNOSIS — R569 Unspecified convulsions: Secondary | ICD-10-CM | POA: Diagnosis not present

## 2021-06-10 DIAGNOSIS — Z471 Aftercare following joint replacement surgery: Secondary | ICD-10-CM | POA: Diagnosis not present

## 2021-06-10 DIAGNOSIS — Z96642 Presence of left artificial hip joint: Secondary | ICD-10-CM | POA: Diagnosis not present

## 2021-06-10 HISTORY — PX: TOTAL HIP ARTHROPLASTY: SHX124

## 2021-06-10 SURGERY — ARTHROPLASTY, HIP, TOTAL,POSTERIOR APPROACH
Anesthesia: General | Site: Hip | Laterality: Left

## 2021-06-10 MED ORDER — BUPIVACAINE LIPOSOME 1.3 % IJ SUSP: 10.0000 mL | Freq: Once | INTRAMUSCULAR | Status: DC

## 2021-06-10 MED ORDER — MIDAZOLAM HCL 5 MG/5ML IJ SOLN
INTRAMUSCULAR | Status: DC | PRN
  Administered 2021-06-10: 2 mg via INTRAVENOUS

## 2021-06-10 MED ORDER — SODIUM CHLORIDE (PF) 0.9 % IJ SOLN
INTRAMUSCULAR | Status: AC
  Filled 2021-06-10: qty 50

## 2021-06-10 MED ORDER — PROPOFOL 1000 MG/100ML IV EMUL
INTRAVENOUS | Status: AC
  Filled 2021-06-10: qty 100

## 2021-06-10 MED ORDER — FENTANYL CITRATE PF 50 MCG/ML IJ SOSY
PREFILLED_SYRINGE | INTRAMUSCULAR | Status: AC
  Filled 2021-06-10: qty 3

## 2021-06-10 MED ORDER — FENTANYL CITRATE PF 50 MCG/ML IJ SOSY
25.0000 ug | PREFILLED_SYRINGE | INTRAMUSCULAR | Status: DC | PRN
  Administered 2021-06-10 (×3): 50 ug via INTRAVENOUS

## 2021-06-10 MED ORDER — ACETAMINOPHEN 500 MG PO TABS: 1000.0000 mg | ORAL_TABLET | Freq: Three times a day (TID) | ORAL | 0 refills | Status: AC | PRN

## 2021-06-10 MED ORDER — DEXAMETHASONE SODIUM PHOSPHATE 10 MG/ML IJ SOLN
INTRAMUSCULAR | Status: AC
  Filled 2021-06-10: qty 1

## 2021-06-10 MED ORDER — CEFAZOLIN SODIUM-DEXTROSE 2-4 GM/100ML-% IV SOLN
INTRAVENOUS | Status: AC
  Filled 2021-06-10: qty 100

## 2021-06-10 MED ORDER — SODIUM CHLORIDE 0.9 % IR SOLN
Status: DC | PRN
  Administered 2021-06-10 (×2): 1000 mL

## 2021-06-10 MED ORDER — DEXAMETHASONE SODIUM PHOSPHATE 10 MG/ML IJ SOLN
INTRAMUSCULAR | Status: DC | PRN
  Administered 2021-06-10: 10 mg via INTRAVENOUS

## 2021-06-10 MED ORDER — ONDANSETRON HCL 4 MG/2ML IJ SOLN
INTRAMUSCULAR | Status: AC
  Filled 2021-06-10: qty 2

## 2021-06-10 MED ORDER — DEXMEDETOMIDINE (PRECEDEX) IN NS 20 MCG/5ML (4 MCG/ML) IV SYRINGE
PREFILLED_SYRINGE | INTRAVENOUS | Status: DC | PRN
  Administered 2021-06-10: 8 ug via INTRAVENOUS
  Administered 2021-06-10: 4 ug via INTRAVENOUS

## 2021-06-10 MED ORDER — SODIUM CHLORIDE (PF) 0.9 % IJ SOLN
INTRAMUSCULAR | Status: AC
  Filled 2021-06-10: qty 10

## 2021-06-10 MED ORDER — LACTATED RINGERS IV BOLUS
250.0000 mL | Freq: Once | INTRAVENOUS | Status: AC
  Administered 2021-06-10: 250 mL via INTRAVENOUS

## 2021-06-10 MED ORDER — ACETAMINOPHEN 500 MG PO TABS
1000.0000 mg | ORAL_TABLET | Freq: Once | ORAL | Status: AC
  Administered 2021-06-10: 1000 mg via ORAL
  Filled 2021-06-10: qty 2

## 2021-06-10 MED ORDER — CEFADROXIL 500 MG PO CAPS: 500.0000 mg | ORAL_CAPSULE | Freq: Two times a day (BID) | ORAL | 0 refills | Status: AC

## 2021-06-10 MED ORDER — POVIDONE-IODINE 10 % EX SWAB
2.0000 "application " | Freq: Once | CUTANEOUS | Status: AC
  Administered 2021-06-10: 2 via TOPICAL

## 2021-06-10 MED ORDER — BUPIVACAINE LIPOSOME 1.3 % IJ SUSP
INTRAMUSCULAR | Status: AC
  Filled 2021-06-10: qty 20

## 2021-06-10 MED ORDER — TRANEXAMIC ACID-NACL 1000-0.7 MG/100ML-% IV SOLN
1000.0000 mg | INTRAVENOUS | Status: AC
  Administered 2021-06-10: 1000 mg via INTRAVENOUS
  Filled 2021-06-10: qty 100

## 2021-06-10 MED ORDER — METHOCARBAMOL 500 MG PO TABS: 500.0000 mg | ORAL_TABLET | Freq: Four times a day (QID) | ORAL | Status: DC | PRN

## 2021-06-10 MED ORDER — PROPOFOL 10 MG/ML IV BOLUS
INTRAVENOUS | Status: AC
  Filled 2021-06-10: qty 20

## 2021-06-10 MED ORDER — LACTATED RINGERS IV BOLUS
500.0000 mL | Freq: Once | INTRAVENOUS | Status: AC
  Administered 2021-06-10: 500 mL via INTRAVENOUS

## 2021-06-10 MED ORDER — ONDANSETRON HCL 4 MG PO TABS: 4.0000 mg | ORAL_TABLET | Freq: Three times a day (TID) | ORAL | 0 refills | Status: AC | PRN

## 2021-06-10 MED ORDER — MIDAZOLAM HCL 2 MG/2ML IJ SOLN
INTRAMUSCULAR | Status: AC
  Filled 2021-06-10: qty 2

## 2021-06-10 MED ORDER — GLYCOPYRROLATE 0.2 MG/ML IJ SOLN
INTRAMUSCULAR | Status: DC | PRN
  Administered 2021-06-10: .1 mg via INTRAVENOUS

## 2021-06-10 MED ORDER — ORAL CARE MOUTH RINSE: 15.0000 mL | Freq: Once | OROMUCOSAL | Status: AC

## 2021-06-10 MED ORDER — METHOCARBAMOL 500 MG IVPB - SIMPLE MED
500.0000 mg | Freq: Four times a day (QID) | INTRAVENOUS | Status: DC | PRN
  Filled 2021-06-10: qty 50

## 2021-06-10 MED ORDER — PANTOPRAZOLE SODIUM 40 MG PO TBEC: 40.0000 mg | DELAYED_RELEASE_TABLET | Freq: Every day | ORAL | 0 refills | Status: AC

## 2021-06-10 MED ORDER — CHLORHEXIDINE GLUCONATE 0.12 % MT SOLN
15.0000 mL | Freq: Once | OROMUCOSAL | Status: AC
  Administered 2021-06-10: 15 mL via OROMUCOSAL

## 2021-06-10 MED ORDER — BUPIVACAINE LIPOSOME 1.3 % IJ SUSP
INTRAMUSCULAR | Status: DC | PRN
  Administered 2021-06-10: 20 mL

## 2021-06-10 MED ORDER — INDOMETHACIN 25 MG PO CAPS: 25.0000 mg | ORAL_CAPSULE | Freq: Three times a day (TID) | ORAL | 0 refills | Status: AC

## 2021-06-10 MED ORDER — 0.9 % SODIUM CHLORIDE (POUR BTL) OPTIME
TOPICAL | Status: DC | PRN
  Administered 2021-06-10: 1000 mL

## 2021-06-10 MED ORDER — FENTANYL CITRATE (PF) 100 MCG/2ML IJ SOLN
INTRAMUSCULAR | Status: AC
  Filled 2021-06-10: qty 2

## 2021-06-10 MED ORDER — ASPIRIN EC 81 MG PO TBEC: 81.0000 mg | DELAYED_RELEASE_TABLET | Freq: Two times a day (BID) | ORAL | 0 refills | Status: AC

## 2021-06-10 MED ORDER — CEFAZOLIN SODIUM-DEXTROSE 2-4 GM/100ML-% IV SOLN
2.0000 g | Freq: Four times a day (QID) | INTRAVENOUS | Status: DC
  Administered 2021-06-10: 2 g via INTRAVENOUS

## 2021-06-10 MED ORDER — SODIUM CHLORIDE (PF) 0.9 % IJ SOLN
INTRAMUSCULAR | Status: DC | PRN
  Administered 2021-06-10: 60 mL

## 2021-06-10 MED ORDER — FENTANYL CITRATE (PF) 100 MCG/2ML IJ SOLN
INTRAMUSCULAR | Status: DC | PRN
  Administered 2021-06-10: 100 ug via INTRAVENOUS

## 2021-06-10 MED ORDER — WATER FOR IRRIGATION, STERILE IR SOLN
Status: DC | PRN
Start: 2021-06-10 — End: 2021-06-10
  Administered 2021-06-10: 2000 mL

## 2021-06-10 MED ORDER — ONDANSETRON HCL 4 MG/2ML IJ SOLN
INTRAMUSCULAR | Status: DC | PRN
  Administered 2021-06-10: 4 mg via INTRAVENOUS

## 2021-06-10 MED ORDER — OXYCODONE HCL 5 MG PO TABS
ORAL_TABLET | ORAL | Status: AC
  Filled 2021-06-10: qty 2

## 2021-06-10 MED ORDER — DEXMEDETOMIDINE (PRECEDEX) IN NS 20 MCG/5ML (4 MCG/ML) IV SYRINGE
PREFILLED_SYRINGE | INTRAVENOUS | Status: AC
  Filled 2021-06-10: qty 5

## 2021-06-10 MED ORDER — OXYCODONE HCL 5 MG PO TABS
5.0000 mg | ORAL_TABLET | ORAL | Status: DC | PRN
  Administered 2021-06-10: 10 mg via ORAL

## 2021-06-10 MED ORDER — OXYCODONE HCL 5 MG PO TABS: 5.0000 mg | ORAL_TABLET | ORAL | 0 refills | Status: AC | PRN

## 2021-06-10 MED ORDER — PROPOFOL 10 MG/ML IV BOLUS
INTRAVENOUS | Status: DC | PRN
  Administered 2021-06-10: 140 mg via INTRAVENOUS

## 2021-06-10 MED ORDER — CEFAZOLIN SODIUM-DEXTROSE 2-4 GM/100ML-% IV SOLN
2.0000 g | INTRAVENOUS | Status: AC
  Administered 2021-06-10: 2 g via INTRAVENOUS
  Filled 2021-06-10: qty 100

## 2021-06-10 MED ORDER — EPHEDRINE SULFATE-NACL 50-0.9 MG/10ML-% IV SOSY
PREFILLED_SYRINGE | INTRAVENOUS | Status: DC | PRN
  Administered 2021-06-10: 5 mg via INTRAVENOUS

## 2021-06-10 MED ORDER — LACTATED RINGERS IV SOLN
INTRAVENOUS | Status: DC
Start: 2021-06-10 — End: 2021-06-10
  Administered 2021-06-10: 1000 mL via INTRAVENOUS

## 2021-06-10 MED ORDER — DEXAMETHASONE SODIUM PHOSPHATE 10 MG/ML IJ SOLN: 8.0000 mg | Freq: Once | INTRAMUSCULAR | Status: DC

## 2021-06-10 MED ORDER — METHOCARBAMOL 500 MG IVPB - SIMPLE MED
INTRAVENOUS | Status: AC
  Administered 2021-06-10: 500 mg
  Filled 2021-06-10: qty 50

## 2021-06-10 MED ORDER — LIDOCAINE 2% (20 MG/ML) 5 ML SYRINGE
INTRAMUSCULAR | Status: DC | PRN
  Administered 2021-06-10: 60 mg via INTRAVENOUS

## 2021-06-10 MED ORDER — POVIDONE-IODINE 10 % EX SWAB: 2.0000 "application " | Freq: Once | CUTANEOUS | Status: DC

## 2021-06-10 MED ORDER — ROCURONIUM BROMIDE 10 MG/ML (PF) SYRINGE
PREFILLED_SYRINGE | INTRAVENOUS | Status: DC | PRN
  Administered 2021-06-10: 50 mg via INTRAVENOUS

## 2021-06-10 SURGICAL SUPPLY — 65 items
BAG COUNTER SPONGE SURGICOUNT (BAG) ×1 IMPLANT
BAG DECANTER FOR FLEXI CONT (MISCELLANEOUS) ×2 IMPLANT
BAG ZIPLOCK 12X15 (MISCELLANEOUS) ×2 IMPLANT
BLADE SAW SAG 25X90X1.19 (BLADE) ×1 IMPLANT
CHLORAPREP W/TINT 26 (MISCELLANEOUS) ×4 IMPLANT
COVER SURGICAL LIGHT HANDLE (MISCELLANEOUS) ×2 IMPLANT
DERMABOND ADVANCED (GAUZE/BANDAGES/DRESSINGS) ×1
DERMABOND ADVANCED .7 DNX12 (GAUZE/BANDAGES/DRESSINGS) ×1 IMPLANT
DRAPE 3/4 80X56 (DRAPES) ×4 IMPLANT
DRAPE HIP W/POCKET STRL (MISCELLANEOUS) ×2 IMPLANT
DRAPE INCISE IOBAN 66X45 STRL (DRAPES) ×2 IMPLANT
DRAPE INCISE IOBAN 85X60 (DRAPES) ×2 IMPLANT
DRAPE POUCH INSTRU U-SHP 10X18 (DRAPES) ×2 IMPLANT
DRAPE SURG 17X11 SM STRL (DRAPES) ×2 IMPLANT
DRAPE U-SHAPE 47X51 STRL (DRAPES) ×2 IMPLANT
DRESSING AQUACEL AG SP 3.5X10 (GAUZE/BANDAGES/DRESSINGS) ×1 IMPLANT
DRSG AQUACEL AG ADV 3.5X10 (GAUZE/BANDAGES/DRESSINGS) ×1 IMPLANT
DRSG AQUACEL AG SP 3.5X10 (GAUZE/BANDAGES/DRESSINGS) ×2
ELECT BLADE TIP CTD 4 INCH (ELECTRODE) ×2 IMPLANT
ELECT REM PT RETURN 15FT ADLT (MISCELLANEOUS) ×2 IMPLANT
GLOVE BIOGEL PI IND STRL 8 (GLOVE) ×2 IMPLANT
GLOVE BIOGEL PI INDICATOR 8 (GLOVE) ×2
GLOVE SURG LX 8.0 MICRO (GLOVE) ×2
GLOVE SURG LX STRL 8.0 MICRO (GLOVE) ×2 IMPLANT
GLOVE SURG ORTHO 8.0 STRL STRW (GLOVE) ×2 IMPLANT
GOWN STRL REUS W/ TWL XL LVL3 (GOWN DISPOSABLE) ×1 IMPLANT
GOWN STRL REUS W/TWL XL LVL3 (GOWN DISPOSABLE) ×2
HANDPIECE INTERPULSE COAX TIP (DISPOSABLE)
HEAD CERAMIC FEMORAL 36MM (Head) ×1 IMPLANT
HOLDER FOLEY CATH W/STRAP (MISCELLANEOUS) ×2 IMPLANT
HOOD PEEL AWAY FLYTE STAYCOOL (MISCELLANEOUS) ×6 IMPLANT
INSERT TRIDENT POLY 36 0DEG (Insert) ×1 IMPLANT
KIT BASIN OR (CUSTOM PROCEDURE TRAY) ×2 IMPLANT
KIT TURNOVER KIT A (KITS) ×1 IMPLANT
MANIFOLD NEPTUNE II (INSTRUMENTS) ×2 IMPLANT
MARKER SKIN DUAL TIP RULER LAB (MISCELLANEOUS) ×2 IMPLANT
NEEDLE HYPO 22GX1.5 SAFETY (NEEDLE) ×1 IMPLANT
NS IRRIG 1000ML POUR BTL (IV SOLUTION) ×2 IMPLANT
PACK TOTAL JOINT (CUSTOM PROCEDURE TRAY) ×2 IMPLANT
PROTECTOR NERVE ULNAR (MISCELLANEOUS) ×2 IMPLANT
RETRIEVER SUT HEWSON (MISCELLANEOUS) ×2 IMPLANT
SCREW HEX LP 6.5X20 (Screw) ×1 IMPLANT
SCREW HEX LP 6.5X35 (Screw) ×1 IMPLANT
SEALER BIPOLAR AQUA 6.0 (INSTRUMENTS) ×2 IMPLANT
SET HNDPC FAN SPRY TIP SCT (DISPOSABLE) IMPLANT
SHELL CLUSTERHOLE ACETABULAR 5 (Shell) ×1 IMPLANT
SPIKE FLUID TRANSFER (MISCELLANEOUS) ×6 IMPLANT
SPONGE T-LAP 18X18 ~~LOC~~+RFID (SPONGE) ×6 IMPLANT
STEM NECK ANGLE HIP 3 132D (Stem) ×1 IMPLANT
SUCTION FRAZIER HANDLE 12FR (TUBING) ×2
SUCTION TUBE FRAZIER 12FR DISP (TUBING) ×1 IMPLANT
SUT BONE WAX W31G (SUTURE) ×2 IMPLANT
SUT ETHIBOND #5 BRAIDED 30INL (SUTURE) ×2 IMPLANT
SUT MNCRL AB 3-0 PS2 18 (SUTURE) ×2 IMPLANT
SUT STRATAFIX 0 PDS 27 VIOLET (SUTURE) ×2
SUT STRATAFIX PDO 1 14 VIOLET (SUTURE) ×2
SUT STRATFX PDO 1 14 VIOLET (SUTURE) ×1
SUT VIC AB 2-0 CT2 27 (SUTURE) ×5 IMPLANT
SUTURE STRATFX 0 PDS 27 VIOLET (SUTURE) ×1 IMPLANT
SUTURE STRATFX PDO 1 14 VIOLET (SUTURE) ×1 IMPLANT
SYR 20ML LL LF (SYRINGE) ×4 IMPLANT
TOWEL OR 17X26 10 PK STRL BLUE (TOWEL DISPOSABLE) ×2 IMPLANT
TRAY FOLEY MTR SLVR 16FR STAT (SET/KITS/TRAYS/PACK) ×2 IMPLANT
UNDERPAD 30X36 HEAVY ABSORB (UNDERPADS AND DIAPERS) ×2 IMPLANT
WATER STERILE IRR 1000ML POUR (IV SOLUTION) ×4 IMPLANT

## 2021-06-10 NOTE — Discharge Instructions (Signed)
INSTRUCTIONS AFTER JOINT REPLACEMENT  ? ?Remove items at home which could result in a fall. This includes throw rugs or furniture in walking pathways ?ICE to the affected joint every three hours while awake for 30 minutes at a time, for at least the first 3-5 days, and then as needed for pain and swelling.  Continue to use ice for pain and swelling. You may notice swelling that will progress down to the foot and ankle.  This is normal after surgery.  Elevate your leg when you are not up walking on it.   ?Continue to use the breathing machine you got in the hospital (incentive spirometer) which will help keep your temperature down.  It is common for your temperature to cycle up and down following surgery, especially at night when you are not up moving around and exerting yourself.  The breathing machine keeps your lungs expanded and your temperature down. ? ? ?DIET:  As you were doing prior to hospitalization, we recommend a well-balanced diet. ? ?DRESSING / WOUND CARE / SHOWERING ? ?Keep the surgical dressing until follow up.  The dressing is water proof, so you can shower without any extra covering.  IF THE DRESSING FALLS OFF or the wound gets wet inside, change the dressing with sterile gauze.  Please use good hand washing techniques before changing the dressing.  Do not use any lotions or creams on the incision until instructed by your surgeon.   ? ?ACTIVITY ? ?Increase activity slowly as tolerated, but follow the weight bearing instructions below.   ?No driving for 6 weeks or until further direction given by your physician.  You cannot drive while taking narcotics.  ?No lifting or carrying greater than 10 lbs. until further directed by your surgeon. ?Avoid periods of inactivity such as sitting longer than an hour when not asleep. This helps prevent blood clots.  ?You may return to work once you are authorized by your doctor.  ? ? ? ?WEIGHT BEARING  ? ?Weight bearing as tolerated with assist device (walker, cane,  etc) as directed, use it as long as suggested by your surgeon or therapist, typically at least 4-6 weeks. ? ? ?EXERCISES ? ?Results after joint replacement surgery are often greatly improved when you follow the exercise, range of motion and muscle strengthening exercises prescribed by your doctor. Safety measures are also important to protect the joint from further injury. Any time any of these exercises cause you to have increased pain or swelling, decrease what you are doing until you are comfortable again and then slowly increase them. If you have problems or questions, call your caregiver or physical therapist for advice.  ? ?Rehabilitation is important following a joint replacement. After just a few days of immobilization, the muscles of the leg can become weakened and shrink (atrophy).  These exercises are designed to build up the tone and strength of the thigh and leg muscles and to improve motion. Often times heat used for twenty to thirty minutes before working out will loosen up your tissues and help with improving the range of motion but do not use heat for the first two weeks following surgery (sometimes heat can increase post-operative swelling).  ? ?These exercises can be done on a training (exercise) mat, on the floor, on a table or on a bed. Use whatever works the best and is most comfortable for you.    Use music or television while you are exercising so that the exercises are a pleasant break in your   day. This will make your life better with the exercises acting as a break in your routine that you can look forward to.   Perform all exercises about fifteen times, three times per day or as directed.  You should exercise both the operative leg and the other leg as well. ? ?Exercises include: ?  ?Quad Sets - Tighten up the muscle on the front of the thigh (Quad) and hold for 5-10 seconds.   ?Straight Leg Raises - With your knee straight (if you were given a brace, keep it on), lift the leg to 60  degrees, hold for 3 seconds, and slowly lower the leg.  Perform this exercise against resistance later as your leg gets stronger.  ?Leg Slides: Lying on your back, slowly slide your foot toward your buttocks, bending your knee up off the floor (only go as far as is comfortable). Then slowly slide your foot back down until your leg is flat on the floor again.  ?Angel Wings: Lying on your back spread your legs to the side as far apart as you can without causing discomfort.  ?Hamstring Strength:  Lying on your back, push your heel against the floor with your leg straight by tightening up the muscles of your buttocks.  Repeat, but this time bend your knee to a comfortable angle, and push your heel against the floor.  You may put a pillow under the heel to make it more comfortable if necessary.  ? ?A rehabilitation program following joint replacement surgery can speed recovery and prevent re-injury in the future due to weakened muscles. Contact your doctor or a physical therapist for more information on knee rehabilitation.  ? ? ?CONSTIPATION ? ?Constipation is defined medically as fewer than three stools per week and severe constipation as less than one stool per week.  Even if you have a regular bowel pattern at home, your normal regimen is likely to be disrupted due to multiple reasons following surgery.  Combination of anesthesia, postoperative narcotics, change in appetite and fluid intake all can affect your bowels.  ? ?YOU MUST use at least one of the following options; they are listed in order of increasing strength to get the job done.  They are all available over the counter, and you may need to use some, POSSIBLY even all of these options:   ? ?Drink plenty of fluids (prune juice may be helpful) and high fiber foods ?Colace 100 mg by mouth twice a day  ?Senokot for constipation as directed and as needed Dulcolax (bisacodyl), take with full glass of water  ?Miralax (polyethylene glycol) once or twice a day as  needed. ? ?If you have tried all these things and are unable to have a bowel movement in the first 3-4 days after surgery call either your surgeon or your primary doctor.   ? ?If you experience loose stools or diarrhea, hold the medications until you stool forms back up.  If your symptoms do not get better within 1 week or if they get worse, check with your doctor.  If you experience "the worst abdominal pain ever" or develop nausea or vomiting, please contact the office immediately for further recommendations for treatment. ? ? ?ITCHING:  If you experience itching with your medications, try taking only a single pain pill, or even half a pain pill at a time.  You can also use Benadryl over the counter for itching or also to help with sleep.  ? ?TED HOSE STOCKINGS:  Use stockings on both   legs until for at least 2 weeks or as directed by physician office. They may be removed at night for sleeping. ? ?MEDICATIONS:  See your medication summary on the ?After Visit Summary? that nursing will review with you.  You may have some home medications which will be placed on hold until you complete the course of blood thinner medication.  It is important for you to complete the blood thinner medication as prescribed. ? ? ?Blood clot prevention (DVT Prophylaxis): After surgery you are at an increased risk for a blood clot. you were prescribed a blood thinner, Aspirin '81mg'$ , to be taken twice daily for a total of 4 weeks from surgery to help reduce your risk of getting a blood clot. This will help prevent a blood clot. Signs of a pulmonary embolus (blood clot in the lungs) include sudden short of breath, feeling lightheaded or dizzy, chest pain with a deep breath, rapid pulse rapid breathing. Signs of a blood clot in your arms or legs include new unexplained swelling and cramping, warm, red or darkened skin around the painful area. Please call the office or 911 right away if these signs or symptoms develop. ? ?PRECAUTIONS:  If you  experience chest pain or shortness of breath - call 911 immediately for transfer to the hospital emergency department.  ? ?If you develop a fever greater that 101 F, purulent drainage from wound, increased r
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
## 2021-06-10 NOTE — Anesthesia Postprocedure Evaluation (Signed)
Anesthesia Post Note ? ?Patient: [name] ? ?Procedure(s) Performed: TOTAL HIP ARTHROPLASTY (Left: Hip) ? ?  ? ?Patient location during evaluation: PACU ?Anesthesia Type: General ?Level of consciousness: awake ?Pain management: pain level controlled ?Vital Signs Assessment: post-procedure vital signs reviewed and stable ?Respiratory status: spontaneous breathing ?Cardiovascular status: stable ?Postop Assessment: no apparent nausea or vomiting ?Anesthetic complications: no ? ? ?No notable events documented. ? ?Last Vitals:  ?Vitals:  ? 06/10/21 1300 06/10/21 1400  ?BP: 107/64 110/72  ?Pulse: (!) 54 (!) 56  ?Resp: 18 18  ?Temp:    ?SpO2: 98% 99%  ?  ?Last Pain:  ?Vitals:  ? 06/10/21 1400  ?TempSrc:   ?PainSc: 2   ? ? ?  ?  ?  ?  ?  ?  ? ?Khan Chura ? ? ? ? ?

## 2021-06-10 NOTE — Anesthesia Procedure Notes (Signed)
Procedure Name: Intubation ?Date/Time: 06/10/2021 7:30 AM ?Performed by: Gerald Leitz, CRNA ?Pre-anesthesia Checklist: Patient identified, Patient being monitored, Timeout performed, Emergency Drugs available and Suction available ?Patient Re-evaluated:Patient Re-evaluated prior to induction ?Oxygen Delivery Method: Circle system utilized ?Preoxygenation: Pre-oxygenation with 100% oxygen ?Induction Type: IV induction ?Ventilation: Mask ventilation without difficulty ?Laryngoscope Size: Mac and 3 ?Grade View: Grade I ?Tube type: Oral ?Tube size: 7.0 mm ?Number of attempts: 1 ?Placement Confirmation: ETT inserted through vocal cords under direct vision, positive ETCO2 and breath sounds checked- equal and bilateral ?Secured at: 21 cm ?Tube secured with: Tape ?Dental Injury: Teeth and Oropharynx as per pre-operative assessment  ? ? ? ? ?

## 2021-06-10 NOTE — Interval H&P Note (Signed)
The patient has been re-examined, and the chart reviewed, and there have been no interval changes to the documented history and physical.   ? ?Plan for Left total hip arthroplasty. Discussed plan for HO prophylaxis with indocin postop. 3-6 weeks as long as patient can tolerate. ? ?The operative side was examined and the patient was confirmed to have. Sens DPN, SPN, TN intact, Motor EHL, ext, flex 5/5, and DP 2+, PT 2+, No significant edema. ? ? ?The risks, benefits, and alternatives have been discussed at length with patient, and the patient is willing to proceed.  Left hip marked. Consent has been signed. ? ? ? ?
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
## 2021-06-10 NOTE — Transfer of Care (Signed)
Immediate Anesthesia Transfer of Care Note ? ?Patient: [name] ? ?Procedure(s) Performed: Procedure(s): ?TOTAL HIP ARTHROPLASTY (Left) ? ?Patient Location: PACU ? ?Anesthesia Type:Spinal ? ?Level of Consciousness: awake, alert  and oriented ? ?Airway & Oxygen Therapy: Patient Spontanous Breathing ? ?Post-op Assessment: Report given to RN and Post -op Vital signs reviewed and stable ? ?Post vital signs: Reviewed and stable ? ?Last Vitals:  ?Vitals:  ? 06/10/21 1025 06/10/21 1030  ?BP: 113/72 114/73  ?Pulse: 60 78  ?Resp: 18 (!) 23  ?Temp: (!) 36.1 ?C   ?SpO2: 100% 100%  ? ? ?Complications: No apparent anesthesia complications ? ?

## 2021-06-11 LAB — TYPE AND SCREEN
ABO/RH(D): A POS
Antibody Screen: POSITIVE
Unit division: 0

## 2021-06-11 LAB — BPAM RBC
Blood Product Expiration Date: 202305182359
Unit Type and Rh: 6200

## 2021-06-12 ENCOUNTER — Encounter (HOSPITAL_COMMUNITY): Payer: Self-pay | Admitting: Physical Therapy

## 2021-06-12 ENCOUNTER — Ambulatory Visit (HOSPITAL_COMMUNITY): Payer: 59 | Attending: Orthopedic Surgery | Admitting: Physical Therapy

## 2021-06-12 DIAGNOSIS — R2689 Other abnormalities of gait and mobility: Secondary | ICD-10-CM | POA: Insufficient documentation

## 2021-06-12 DIAGNOSIS — R29898 Other symptoms and signs involving the musculoskeletal system: Secondary | ICD-10-CM | POA: Insufficient documentation

## 2021-06-12 DIAGNOSIS — M6281 Muscle weakness (generalized): Secondary | ICD-10-CM | POA: Insufficient documentation

## 2021-06-12 DIAGNOSIS — M25552 Pain in left hip: Secondary | ICD-10-CM | POA: Insufficient documentation
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
## 2021-06-14 ENCOUNTER — Ambulatory Visit (HOSPITAL_COMMUNITY): Payer: 59

## 2021-06-14 DIAGNOSIS — M6281 Muscle weakness (generalized): Secondary | ICD-10-CM | POA: Diagnosis not present

## 2021-06-14 DIAGNOSIS — R2689 Other abnormalities of gait and mobility: Secondary | ICD-10-CM | POA: Diagnosis not present

## 2021-06-14 DIAGNOSIS — M25552 Pain in left hip: Secondary | ICD-10-CM | POA: Diagnosis not present

## 2021-06-14 DIAGNOSIS — R29898 Other symptoms and signs involving the musculoskeletal system: Secondary | ICD-10-CM
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
## 2021-06-17 ENCOUNTER — Ambulatory Visit (HOSPITAL_COMMUNITY): Payer: 59 | Admitting: Physical Therapy

## 2021-06-17 ENCOUNTER — Encounter (HOSPITAL_COMMUNITY): Payer: Self-pay | Admitting: Physical Therapy

## 2021-06-17 DIAGNOSIS — M6281 Muscle weakness (generalized): Secondary | ICD-10-CM | POA: Diagnosis not present

## 2021-06-17 DIAGNOSIS — R2689 Other abnormalities of gait and mobility: Secondary | ICD-10-CM | POA: Diagnosis not present

## 2021-06-17 DIAGNOSIS — M25552 Pain in left hip: Secondary | ICD-10-CM | POA: Diagnosis not present

## 2021-06-17 DIAGNOSIS — R29898 Other symptoms and signs involving the musculoskeletal system: Secondary | ICD-10-CM
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
## 2021-06-19 ENCOUNTER — Ambulatory Visit (HOSPITAL_COMMUNITY): Payer: 59 | Admitting: Physical Therapy

## 2021-06-19 ENCOUNTER — Encounter (HOSPITAL_COMMUNITY): Payer: Self-pay | Admitting: Physical Therapy

## 2021-06-19 DIAGNOSIS — R2689 Other abnormalities of gait and mobility: Secondary | ICD-10-CM | POA: Diagnosis not present

## 2021-06-19 DIAGNOSIS — M25552 Pain in left hip: Secondary | ICD-10-CM

## 2021-06-19 DIAGNOSIS — R29898 Other symptoms and signs involving the musculoskeletal system: Secondary | ICD-10-CM

## 2021-06-19 DIAGNOSIS — M6281 Muscle weakness (generalized): Secondary | ICD-10-CM
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
## 2021-06-21 ENCOUNTER — Encounter (HOSPITAL_COMMUNITY): Payer: 59

## 2021-06-24 ENCOUNTER — Ambulatory Visit (HOSPITAL_COMMUNITY): Payer: 59 | Admitting: Physical Therapy

## 2021-06-24 ENCOUNTER — Encounter (HOSPITAL_COMMUNITY): Payer: Self-pay | Admitting: Physical Therapy

## 2021-06-24 DIAGNOSIS — R29898 Other symptoms and signs involving the musculoskeletal system: Secondary | ICD-10-CM

## 2021-06-24 DIAGNOSIS — M25552 Pain in left hip: Secondary | ICD-10-CM | POA: Diagnosis not present

## 2021-06-24 DIAGNOSIS — R2689 Other abnormalities of gait and mobility: Secondary | ICD-10-CM

## 2021-06-24 DIAGNOSIS — M6281 Muscle weakness (generalized): Secondary | ICD-10-CM | POA: Diagnosis not present
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
## 2021-06-26 ENCOUNTER — Ambulatory Visit (HOSPITAL_COMMUNITY): Payer: 59 | Admitting: Physical Therapy

## 2021-06-26 ENCOUNTER — Encounter (HOSPITAL_COMMUNITY): Payer: Self-pay | Admitting: Physical Therapy

## 2021-06-26 DIAGNOSIS — R29898 Other symptoms and signs involving the musculoskeletal system: Secondary | ICD-10-CM

## 2021-06-26 DIAGNOSIS — M6281 Muscle weakness (generalized): Secondary | ICD-10-CM

## 2021-06-26 DIAGNOSIS — M25552 Pain in left hip: Secondary | ICD-10-CM

## 2021-06-26 DIAGNOSIS — R2689 Other abnormalities of gait and mobility: Secondary | ICD-10-CM | POA: Diagnosis not present
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
## 2021-06-28 ENCOUNTER — Encounter (HOSPITAL_COMMUNITY): Payer: 59

## 2021-06-28 ENCOUNTER — Other Ambulatory Visit (HOSPITAL_COMMUNITY): Payer: Self-pay | Admitting: Family Medicine

## 2021-06-28 DIAGNOSIS — Z1231 Encounter for screening mammogram for malignant neoplasm of breast: Secondary | ICD-10-CM

## 2021-07-01 ENCOUNTER — Encounter (HOSPITAL_COMMUNITY): Payer: 59 | Admitting: Physical Therapy

## 2021-07-03 ENCOUNTER — Ambulatory Visit (HOSPITAL_COMMUNITY): Payer: 59 | Attending: Orthopedic Surgery | Admitting: Physical Therapy

## 2021-07-03 ENCOUNTER — Encounter (HOSPITAL_COMMUNITY): Payer: Self-pay | Admitting: Physical Therapy

## 2021-07-03 DIAGNOSIS — M6281 Muscle weakness (generalized): Secondary | ICD-10-CM

## 2021-07-03 DIAGNOSIS — R29898 Other symptoms and signs involving the musculoskeletal system: Secondary | ICD-10-CM

## 2021-07-03 DIAGNOSIS — M25552 Pain in left hip: Secondary | ICD-10-CM | POA: Diagnosis not present

## 2021-07-03 DIAGNOSIS — Z96642 Presence of left artificial hip joint: Secondary | ICD-10-CM | POA: Diagnosis not present

## 2021-07-03 DIAGNOSIS — R2689 Other abnormalities of gait and mobility: Secondary | ICD-10-CM
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
## 2021-07-05 ENCOUNTER — Ambulatory Visit (HOSPITAL_COMMUNITY): Payer: 59 | Attending: Orthopedic Surgery

## 2021-07-05 DIAGNOSIS — M6281 Muscle weakness (generalized): Secondary | ICD-10-CM | POA: Diagnosis not present

## 2021-07-05 DIAGNOSIS — R2689 Other abnormalities of gait and mobility: Secondary | ICD-10-CM | POA: Diagnosis not present

## 2021-07-05 DIAGNOSIS — R29898 Other symptoms and signs involving the musculoskeletal system: Secondary | ICD-10-CM | POA: Insufficient documentation

## 2021-07-05 DIAGNOSIS — M25552 Pain in left hip: Secondary | ICD-10-CM | POA: Diagnosis not present
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
## 2021-07-08 ENCOUNTER — Ambulatory Visit (HOSPITAL_COMMUNITY): Payer: 59

## 2021-07-08 DIAGNOSIS — M25552 Pain in left hip: Secondary | ICD-10-CM

## 2021-07-08 DIAGNOSIS — R2689 Other abnormalities of gait and mobility: Secondary | ICD-10-CM

## 2021-07-08 DIAGNOSIS — M6281 Muscle weakness (generalized): Secondary | ICD-10-CM

## 2021-07-08 DIAGNOSIS — R29898 Other symptoms and signs involving the musculoskeletal system: Secondary | ICD-10-CM
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
## 2021-07-10 ENCOUNTER — Ambulatory Visit (HOSPITAL_COMMUNITY): Payer: 59

## 2021-07-10 DIAGNOSIS — M6281 Muscle weakness (generalized): Secondary | ICD-10-CM

## 2021-07-10 DIAGNOSIS — M25552 Pain in left hip: Secondary | ICD-10-CM

## 2021-07-10 DIAGNOSIS — R29898 Other symptoms and signs involving the musculoskeletal system: Secondary | ICD-10-CM | POA: Diagnosis not present

## 2021-07-10 DIAGNOSIS — R2689 Other abnormalities of gait and mobility: Secondary | ICD-10-CM | POA: Diagnosis not present
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
## 2021-07-15 ENCOUNTER — Encounter (HOSPITAL_COMMUNITY): Payer: Self-pay | Admitting: Physical Therapy

## 2021-07-15 ENCOUNTER — Ambulatory Visit (HOSPITAL_COMMUNITY): Payer: 59

## 2021-07-15 ENCOUNTER — Ambulatory Visit (HOSPITAL_COMMUNITY): Payer: 59 | Attending: Orthopedic Surgery | Admitting: Physical Therapy

## 2021-07-15 DIAGNOSIS — M25552 Pain in left hip: Secondary | ICD-10-CM | POA: Diagnosis not present

## 2021-07-15 DIAGNOSIS — M6281 Muscle weakness (generalized): Secondary | ICD-10-CM

## 2021-07-15 DIAGNOSIS — M1652 Unilateral post-traumatic osteoarthritis, left hip: Secondary | ICD-10-CM | POA: Insufficient documentation

## 2021-07-15 DIAGNOSIS — R2689 Other abnormalities of gait and mobility: Secondary | ICD-10-CM

## 2021-07-15 DIAGNOSIS — Z96642 Presence of left artificial hip joint: Secondary | ICD-10-CM | POA: Insufficient documentation

## 2021-07-15 DIAGNOSIS — R29898 Other symptoms and signs involving the musculoskeletal system: Secondary | ICD-10-CM
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
## 2021-07-17 ENCOUNTER — Encounter (HOSPITAL_COMMUNITY): Payer: Self-pay | Admitting: Physical Therapy

## 2021-07-17 ENCOUNTER — Ambulatory Visit (HOSPITAL_COMMUNITY): Payer: 59 | Attending: Orthopedic Surgery | Admitting: Physical Therapy

## 2021-07-17 DIAGNOSIS — R29898 Other symptoms and signs involving the musculoskeletal system: Secondary | ICD-10-CM | POA: Diagnosis not present

## 2021-07-17 DIAGNOSIS — R2689 Other abnormalities of gait and mobility: Secondary | ICD-10-CM | POA: Diagnosis not present

## 2021-07-17 DIAGNOSIS — Z96642 Presence of left artificial hip joint: Secondary | ICD-10-CM | POA: Diagnosis not present

## 2021-07-17 DIAGNOSIS — M25552 Pain in left hip: Secondary | ICD-10-CM | POA: Diagnosis not present

## 2021-07-17 DIAGNOSIS — M6281 Muscle weakness (generalized): Secondary | ICD-10-CM | POA: Diagnosis not present
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
## 2021-07-22 ENCOUNTER — Ambulatory Visit (HOSPITAL_COMMUNITY): Payer: 59 | Attending: Orthopedic Surgery | Admitting: Physical Therapy

## 2021-07-22 DIAGNOSIS — R2689 Other abnormalities of gait and mobility: Secondary | ICD-10-CM

## 2021-07-22 DIAGNOSIS — M6281 Muscle weakness (generalized): Secondary | ICD-10-CM | POA: Insufficient documentation

## 2021-07-22 DIAGNOSIS — M25552 Pain in left hip: Secondary | ICD-10-CM | POA: Diagnosis not present

## 2021-07-22 DIAGNOSIS — Z96642 Presence of left artificial hip joint: Secondary | ICD-10-CM | POA: Insufficient documentation

## 2021-07-22 DIAGNOSIS — R269 Unspecified abnormalities of gait and mobility: Secondary | ICD-10-CM | POA: Diagnosis not present

## 2021-07-22 DIAGNOSIS — R29898 Other symptoms and signs involving the musculoskeletal system: Secondary | ICD-10-CM
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
## 2021-07-24 ENCOUNTER — Encounter (HOSPITAL_COMMUNITY): Payer: Self-pay | Admitting: Physical Therapy

## 2021-07-24 ENCOUNTER — Ambulatory Visit (HOSPITAL_COMMUNITY): Payer: 59 | Attending: Orthopedic Surgery | Admitting: Physical Therapy

## 2021-07-24 DIAGNOSIS — M6281 Muscle weakness (generalized): Secondary | ICD-10-CM | POA: Insufficient documentation

## 2021-07-24 DIAGNOSIS — R2689 Other abnormalities of gait and mobility: Secondary | ICD-10-CM | POA: Diagnosis not present

## 2021-07-24 DIAGNOSIS — M25552 Pain in left hip: Secondary | ICD-10-CM | POA: Diagnosis not present

## 2021-07-24 DIAGNOSIS — R29898 Other symptoms and signs involving the musculoskeletal system: Secondary | ICD-10-CM | POA: Diagnosis not present
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
## 2021-07-29 ENCOUNTER — Encounter (HOSPITAL_COMMUNITY): Payer: Self-pay | Admitting: Physical Therapy

## 2021-07-29 ENCOUNTER — Ambulatory Visit (HOSPITAL_COMMUNITY): Payer: 59 | Admitting: Physical Therapy

## 2021-07-29 DIAGNOSIS — M6281 Muscle weakness (generalized): Secondary | ICD-10-CM

## 2021-07-29 DIAGNOSIS — R2689 Other abnormalities of gait and mobility: Secondary | ICD-10-CM

## 2021-07-29 DIAGNOSIS — M25552 Pain in left hip: Secondary | ICD-10-CM | POA: Diagnosis not present

## 2021-07-29 DIAGNOSIS — R29898 Other symptoms and signs involving the musculoskeletal system: Secondary | ICD-10-CM | POA: Diagnosis not present

## 2021-07-31 ENCOUNTER — Encounter (HOSPITAL_COMMUNITY): Payer: Self-pay | Admitting: Physical Therapy

## 2021-07-31 ENCOUNTER — Ambulatory Visit (HOSPITAL_COMMUNITY): Payer: 59 | Admitting: Physical Therapy

## 2021-07-31 DIAGNOSIS — R2689 Other abnormalities of gait and mobility: Secondary | ICD-10-CM | POA: Diagnosis not present

## 2021-07-31 DIAGNOSIS — M25552 Pain in left hip: Secondary | ICD-10-CM

## 2021-07-31 DIAGNOSIS — M6281 Muscle weakness (generalized): Secondary | ICD-10-CM | POA: Diagnosis not present

## 2021-07-31 DIAGNOSIS — R29898 Other symptoms and signs involving the musculoskeletal system: Secondary | ICD-10-CM | POA: Diagnosis not present
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
## 2021-08-05 ENCOUNTER — Encounter (HOSPITAL_COMMUNITY): Payer: 59 | Admitting: Physical Therapy

## 2021-08-07 ENCOUNTER — Encounter (HOSPITAL_COMMUNITY): Payer: 59

## 2021-09-11 ENCOUNTER — Ambulatory Visit (HOSPITAL_COMMUNITY): Payer: 59

## 2021-09-12 DIAGNOSIS — M1612 Unilateral primary osteoarthritis, left hip: Secondary | ICD-10-CM | POA: Diagnosis not present

## 2021-11-14 ENCOUNTER — Other Ambulatory Visit: Payer: Self-pay | Admitting: Obstetrics & Gynecology

## 2021-11-19 ENCOUNTER — Other Ambulatory Visit: Payer: Self-pay | Admitting: *Deleted

## 2021-11-19 ENCOUNTER — Telehealth: Payer: Self-pay | Admitting: Obstetrics & Gynecology

## 2021-11-19 MED ORDER — ESTRADIOL 1 MG PO TABS: 1.0000 mg | ORAL_TABLET | Freq: Every day | ORAL | 3 refills | Status: AC

## 2021-11-19 NOTE — Telephone Encounter (Signed)
Patient called stating that she needs a refill on the estradiol to be sent to [facility] stating that she has been out for a week. I do see that the patient is pass due for her annual. It has been since 2021 since the patient has been in the office. Please advise if this will be refilled .

## 2021-11-19 NOTE — Telephone Encounter (Signed)
Refill was sent in.

## 2022-04-02 DIAGNOSIS — R413 Other amnesia: Secondary | ICD-10-CM | POA: Diagnosis not present

## 2022-04-02 DIAGNOSIS — Z6833 Body mass index (BMI) 33.0-33.9, adult: Secondary | ICD-10-CM | POA: Diagnosis not present

## 2022-04-02 DIAGNOSIS — Z029 Encounter for administrative examinations, unspecified: Secondary | ICD-10-CM | POA: Diagnosis not present

## 2022-04-02 DIAGNOSIS — E6609 Other obesity due to excess calories: Secondary | ICD-10-CM | POA: Diagnosis not present

## 2022-06-18 DIAGNOSIS — L089 Local infection of the skin and subcutaneous tissue, unspecified: Secondary | ICD-10-CM | POA: Diagnosis not present

## 2022-06-18 DIAGNOSIS — L03116 Cellulitis of left lower limb: Secondary | ICD-10-CM | POA: Diagnosis not present

## 2022-07-14 DIAGNOSIS — Z6832 Body mass index (BMI) 32.0-32.9, adult: Secondary | ICD-10-CM | POA: Diagnosis not present

## 2022-07-14 DIAGNOSIS — E6609 Other obesity due to excess calories: Secondary | ICD-10-CM | POA: Diagnosis not present

## 2022-07-14 DIAGNOSIS — J020 Streptococcal pharyngitis: Secondary | ICD-10-CM | POA: Diagnosis not present

## 2022-08-04 DIAGNOSIS — Z79890 Hormone replacement therapy (postmenopausal): Secondary | ICD-10-CM | POA: Diagnosis not present

## 2022-08-04 DIAGNOSIS — Z809 Family history of malignant neoplasm, unspecified: Secondary | ICD-10-CM | POA: Diagnosis not present

## 2022-08-04 DIAGNOSIS — E669 Obesity, unspecified: Secondary | ICD-10-CM | POA: Diagnosis not present

## 2022-08-04 DIAGNOSIS — Z6830 Body mass index (BMI) 30.0-30.9, adult: Secondary | ICD-10-CM | POA: Diagnosis not present

## 2022-08-04 DIAGNOSIS — Z882 Allergy status to sulfonamides status: Secondary | ICD-10-CM | POA: Diagnosis not present

## 2022-08-04 DIAGNOSIS — Z87891 Personal history of nicotine dependence: Secondary | ICD-10-CM | POA: Diagnosis not present

## 2022-08-04 DIAGNOSIS — I10 Essential (primary) hypertension: Secondary | ICD-10-CM | POA: Diagnosis not present

## 2022-08-04 DIAGNOSIS — Z885 Allergy status to narcotic agent status: Secondary | ICD-10-CM | POA: Diagnosis not present

## 2022-08-04 DIAGNOSIS — M1990 Unspecified osteoarthritis, unspecified site: Secondary | ICD-10-CM | POA: Diagnosis not present

## 2022-08-04 DIAGNOSIS — Z87892 Personal history of anaphylaxis: Secondary | ICD-10-CM | POA: Diagnosis not present

## 2022-08-04 DIAGNOSIS — Z880 Allergy status to penicillin: Secondary | ICD-10-CM | POA: Diagnosis not present

## 2022-08-04 DIAGNOSIS — Z90710 Acquired absence of both cervix and uterus: Secondary | ICD-10-CM | POA: Diagnosis not present

## 2022-09-30 DIAGNOSIS — R6889 Other general symptoms and signs: Secondary | ICD-10-CM | POA: Diagnosis not present

## 2022-09-30 DIAGNOSIS — J029 Acute pharyngitis, unspecified: Secondary | ICD-10-CM | POA: Diagnosis not present

## 2022-09-30 DIAGNOSIS — E6609 Other obesity due to excess calories: Secondary | ICD-10-CM | POA: Diagnosis not present

## 2022-09-30 DIAGNOSIS — Z6833 Body mass index (BMI) 33.0-33.9, adult: Secondary | ICD-10-CM | POA: Diagnosis not present

## 2022-09-30 DIAGNOSIS — Z1331 Encounter for screening for depression: Secondary | ICD-10-CM | POA: Diagnosis not present

## 2022-09-30 DIAGNOSIS — Z20828 Contact with and (suspected) exposure to other viral communicable diseases: Secondary | ICD-10-CM | POA: Diagnosis not present

## 2022-09-30 DIAGNOSIS — Z01818 Encounter for other preprocedural examination: Secondary | ICD-10-CM | POA: Diagnosis not present

## 2022-09-30 DIAGNOSIS — Z789 Other specified health status: Secondary | ICD-10-CM | POA: Diagnosis not present

## 2022-09-30 DIAGNOSIS — Z0000 Encounter for general adult medical examination without abnormal findings: Secondary | ICD-10-CM | POA: Diagnosis not present

## 2022-11-14 ENCOUNTER — Other Ambulatory Visit: Payer: Self-pay | Admitting: Obstetrics & Gynecology

## 2022-11-25 NOTE — Telephone Encounter (Signed)
Pt is requesting refill on Estradiol to [facility].

## 2023-06-10 DIAGNOSIS — Z833 Family history of diabetes mellitus: Secondary | ICD-10-CM | POA: Diagnosis not present

## 2023-06-10 DIAGNOSIS — Z880 Allergy status to penicillin: Secondary | ICD-10-CM | POA: Diagnosis not present

## 2023-06-10 DIAGNOSIS — Z87892 Personal history of anaphylaxis: Secondary | ICD-10-CM | POA: Diagnosis not present

## 2023-06-10 DIAGNOSIS — Z885 Allergy status to narcotic agent status: Secondary | ICD-10-CM | POA: Diagnosis not present

## 2023-06-10 DIAGNOSIS — Z79890 Hormone replacement therapy (postmenopausal): Secondary | ICD-10-CM | POA: Diagnosis not present

## 2023-06-10 DIAGNOSIS — Z96649 Presence of unspecified artificial hip joint: Secondary | ICD-10-CM | POA: Diagnosis not present

## 2023-06-10 DIAGNOSIS — Z8249 Family history of ischemic heart disease and other diseases of the circulatory system: Secondary | ICD-10-CM | POA: Diagnosis not present

## 2023-06-10 DIAGNOSIS — E669 Obesity, unspecified: Secondary | ICD-10-CM | POA: Diagnosis not present

## 2023-06-10 DIAGNOSIS — M1990 Unspecified osteoarthritis, unspecified site: Secondary | ICD-10-CM | POA: Diagnosis not present

## 2023-06-10 DIAGNOSIS — Z87891 Personal history of nicotine dependence: Secondary | ICD-10-CM | POA: Diagnosis not present

## 2023-06-10 DIAGNOSIS — Z809 Family history of malignant neoplasm, unspecified: Secondary | ICD-10-CM | POA: Diagnosis not present

## 2023-06-10 DIAGNOSIS — Z6831 Body mass index (BMI) 31.0-31.9, adult: Secondary | ICD-10-CM | POA: Diagnosis not present

## 2023-08-31 DIAGNOSIS — E6609 Other obesity due to excess calories: Secondary | ICD-10-CM | POA: Diagnosis not present

## 2023-08-31 DIAGNOSIS — Z0000 Encounter for general adult medical examination without abnormal findings: Secondary | ICD-10-CM | POA: Diagnosis not present

## 2023-08-31 DIAGNOSIS — Z6835 Body mass index (BMI) 35.0-35.9, adult: Secondary | ICD-10-CM | POA: Diagnosis not present

## 2023-08-31 DIAGNOSIS — M25472 Effusion, left ankle: Secondary | ICD-10-CM | POA: Diagnosis not present

## 2023-08-31 DIAGNOSIS — M25471 Effusion, right ankle: Secondary | ICD-10-CM | POA: Diagnosis not present

## 2023-12-07 ENCOUNTER — Other Ambulatory Visit: Payer: Self-pay | Admitting: Obstetrics & Gynecology

## 2024-01-15 ENCOUNTER — Ambulatory Visit: Payer: Self-pay | Admitting: Family Medicine

## 2024-04-26 ENCOUNTER — Ambulatory Visit
